# Patient Record
Sex: Female | Born: 1960 | Race: White | Hispanic: No | Marital: Married | Smoking: Never smoker
Health system: Southern US, Community
[De-identification: ages and names within clinical notes are randomized; demographics above are authoritative.]

## PROBLEM LIST (undated history)

## (undated) DIAGNOSIS — E785 Hyperlipidemia, unspecified: Secondary | ICD-10-CM

## (undated) DIAGNOSIS — B009 Herpesviral infection, unspecified: Secondary | ICD-10-CM

## (undated) DIAGNOSIS — K648 Other hemorrhoids: Secondary | ICD-10-CM

## (undated) DIAGNOSIS — K5790 Diverticulosis of intestine, part unspecified, without perforation or abscess without bleeding: Secondary | ICD-10-CM

## (undated) DIAGNOSIS — K5792 Diverticulitis of intestine, part unspecified, without perforation or abscess without bleeding: Secondary | ICD-10-CM

## (undated) DIAGNOSIS — M797 Fibromyalgia: Secondary | ICD-10-CM

## (undated) HISTORY — PX: COSMETIC SURGERY: SHX468

## (undated) HISTORY — DX: Other hemorrhoids: K64.8

## (undated) HISTORY — DX: Diverticulosis of intestine, part unspecified, without perforation or abscess without bleeding: K57.90

## (undated) HISTORY — PX: ABDOMINAL HYSTERECTOMY: SHX81

## (undated) HISTORY — DX: Diverticulitis of intestine, part unspecified, without perforation or abscess without bleeding: K57.92

## (undated) HISTORY — DX: Hyperlipidemia, unspecified: E78.5

## (undated) HISTORY — PX: CHOLECYSTECTOMY: SHX55

## (undated) HISTORY — DX: Herpesviral infection, unspecified: B00.9

## (undated) HISTORY — DX: Fibromyalgia: M79.7

---

## 2006-05-31 ENCOUNTER — Ambulatory Visit (HOSPITAL_COMMUNITY): Admission: RE | Admit: 2006-05-31 | Discharge: 2006-05-31 | Payer: Self-pay | Admitting: Obstetrics & Gynecology

## 2007-03-20 ENCOUNTER — Encounter: Admission: RE | Admit: 2007-03-20 | Discharge: 2007-03-20 | Payer: Self-pay | Admitting: Family Medicine

## 2007-03-29 ENCOUNTER — Encounter: Admission: RE | Admit: 2007-03-29 | Discharge: 2007-03-29 | Payer: Self-pay | Admitting: Family Medicine

## 2008-04-21 ENCOUNTER — Other Ambulatory Visit: Admission: RE | Admit: 2008-04-21 | Discharge: 2008-04-21 | Payer: Self-pay | Admitting: Internal Medicine

## 2008-04-21 ENCOUNTER — Ambulatory Visit: Payer: Self-pay | Admitting: Internal Medicine

## 2008-05-06 ENCOUNTER — Ambulatory Visit: Payer: Self-pay | Admitting: Internal Medicine

## 2008-09-02 ENCOUNTER — Ambulatory Visit: Payer: Self-pay | Admitting: Internal Medicine

## 2008-10-21 ENCOUNTER — Ambulatory Visit: Payer: Self-pay | Admitting: Internal Medicine

## 2008-12-18 ENCOUNTER — Ambulatory Visit: Payer: Self-pay | Admitting: Internal Medicine

## 2009-02-16 ENCOUNTER — Ambulatory Visit: Payer: Self-pay | Admitting: Internal Medicine

## 2009-07-14 ENCOUNTER — Ambulatory Visit: Payer: Self-pay | Admitting: Internal Medicine

## 2009-08-10 ENCOUNTER — Other Ambulatory Visit: Admission: RE | Admit: 2009-08-10 | Discharge: 2009-08-10 | Payer: Self-pay | Admitting: Internal Medicine

## 2009-08-10 ENCOUNTER — Ambulatory Visit: Payer: Self-pay | Admitting: Internal Medicine

## 2010-01-29 ENCOUNTER — Ambulatory Visit: Payer: Self-pay | Admitting: Internal Medicine

## 2010-02-08 ENCOUNTER — Ambulatory Visit: Payer: Self-pay | Admitting: Internal Medicine

## 2010-02-08 ENCOUNTER — Other Ambulatory Visit: Admission: RE | Admit: 2010-02-08 | Discharge: 2010-02-08 | Payer: Self-pay | Admitting: Internal Medicine

## 2010-02-08 LAB — HM PAP SMEAR

## 2010-02-11 ENCOUNTER — Ambulatory Visit: Payer: Self-pay | Admitting: Internal Medicine

## 2010-02-23 ENCOUNTER — Encounter: Payer: Self-pay | Admitting: Internal Medicine

## 2010-03-05 ENCOUNTER — Encounter (INDEPENDENT_AMBULATORY_CARE_PROVIDER_SITE_OTHER): Payer: Self-pay

## 2010-03-09 ENCOUNTER — Ambulatory Visit: Payer: Self-pay | Admitting: Internal Medicine

## 2010-03-16 ENCOUNTER — Ambulatory Visit: Payer: Self-pay | Admitting: Obstetrics and Gynecology

## 2010-03-29 ENCOUNTER — Ambulatory Visit: Payer: Self-pay | Admitting: Internal Medicine

## 2010-04-01 ENCOUNTER — Ambulatory Visit: Payer: Self-pay | Admitting: Internal Medicine

## 2010-07-20 NOTE — Procedures (Signed)
Summary: Colonoscopy  Patient: [name] Note: All result statuses are Final unless otherwise noted.  Tests: (1) Colonoscopy (COL)   COL Colonoscopy           DONE     [facility]     [address]     [place], [place]  [address]           COLONOSCOPY PROCEDURE REPORT           PATIENT:  Tammy Farley, Tammy Farley  MR#:  [id number]     BIRTHDATE:  09-Apr-1961, 49 yrs. old  GENDER:  female     ENDOSCOPIST:  Hedwig Morton. Juanda Chance, MD     REF. BY:  Sharlet Salina, M.D.     PROCEDURE DATE:  04/01/2010     PROCEDURE:  Colonoscopy 38756     ASA CLASS:  Class I     INDICATIONS:  Routine Risk Screening     MEDICATIONS:   Versed 7 mg, Fentanyl 50 mcg           DESCRIPTION OF PROCEDURE:   After the risks benefits and     alternatives of the procedure were thoroughly explained, informed     consent was obtained.  Digital rectal exam was performed and     revealed no rectal masses.   The LB PCF-H180AL [id number] endoscope     was introduced through the anus and advanced to the cecum, which     was identified by both the appendix and ileocecal valve, without     limitations.  The quality of the prep was good, using MiraLax.     The instrument was then slowly withdrawn as the colon was fully     examined.     <<PROCEDUREIMAGES>>           FINDINGS:  Mild diverticulosis was found in the sigmoid colon (see     image1, image4, image5, and image6).  Internal hemorrhoids were     found (see image7, image2, and image3).   Retroflexed views in the     rectum revealed no abnormalities.    The scope was then withdrawn     from the patient and the procedure completed.           COMPLICATIONS:  None     ENDOSCOPIC IMPRESSION:     1) Mild diverticulosis in the sigmoid colon     2) Internal hemorrhoids     RECOMMENDATIONS:     1) high fiber diet     REPEAT EXAM:  In 10 year(s) for.           ______________________________     Hedwig Morton. Juanda Chance, MD           CC:           n.     eSIGNED:   Hedwig Morton.  Brodie at 04/01/2010 10:45 AM           Laury Deep, [id number]  Note: An exclamation mark (!) indicates a result that was not dispersed into the flowsheet. Document Creation Date: 04/01/2010 10:45 AM _______________________________________________________________________  (1) Order result status: Final Collection or observation date-time: 04/01/2010 10:37 Requested date-time:  Receipt date-time:  Reported date-time:  Referring Physician:   Ordering Physician: Lina Sar [id number]) Specimen Source:  Source: Launa Grill Order Number: [id number] Lab site:   Appended Document: Colonoscopy    Clinical Lists Changes  Observations: Added new observation of COLONNXTDUE: 03/2020 (04/01/2010 10:57)

## 2010-07-20 NOTE — Letter (Signed)
Summary: Pre Visit Letter Revised  [facility]  [address] [place], [place] [address]   Phone: [phone]  Fax: [phone]        02/23/2010 MRN: [id number] Tammy Farley [address] [place], [place]  [address]             Procedure Date:  Sept 29,2011   Welcome to the Gastroenterology Division at [facility].    You are scheduled to see a nurse for your pre-procedure visit on Sept 20, 2011 at 10:30am on the 3rd floor at [facility], [address]  We ask that you try to arrive at our office 15 minutes prior to your appointment time to allow for check-in.  Please take a minute to review the attached form.  If you answer "Yes" to one or more of the questions on the first page, we ask that you call the person listed at your earliest opportunity.  If you answer "No" to all of the questions, please complete the rest of the form and bring it to your appointment.    Your nurse visit will consist of discussing your medical and surgical history, your immediate family medical history, and your medications.   If you are unable to list all of your medications on the form, please bring the medication bottles to your appointment and we will list them.  We will need to be aware of both prescribed and over the counter drugs.  We will need to know exact dosage information as well.    Please be prepared to read and sign documents such as consent forms, a financial agreement, and acknowledgement forms.  If necessary, and with your consent, a friend or relative is welcome to sit-in on the nurse visit with you.  Please bring your insurance card so that we may make a copy of it.  If your insurance requires a referral to see a specialist, please bring your referral form from your primary care physician.  No co-pay is required for this nurse visit.     If you cannot keep your appointment, please call [phone] to cancel or reschedule prior to your appointment  date.  This allows [place] the opportunity to schedule an appointment for another patient in need of care.    Thank you for choosing [facility] for your medical needs.  We appreciate the opportunity to care for you.  Please visit [place] at our website  to learn more about our practice.  Sincerely, The Gastroenterology Division

## 2010-07-20 NOTE — Miscellaneous (Signed)
Summary: Lec previsit  Clinical Lists Changes  Medications: Added new medication of MIRALAX   POWD (POLYETHYLENE GLYCOL 3350) As per prep  instructions. - Signed Added new medication of REGLAN 10 MG  TABS (METOCLOPRAMIDE HCL) As per prep instructions. - Signed Added new medication of DULCOLAX 5 MG  TBEC (BISACODYL) Day before procedure take 2 at 3pm and 2 at 8pm. - Signed Rx of MIRALAX   POWD (POLYETHYLENE GLYCOL 3350) As per prep  instructions.;  #255gm x 0;  Signed;  Entered by: Ulis Rias RN;  Authorized by: Hart Carwin MD;  Method used: Electronically to [facility] [address] [place], [place]  [address], Ph: [phone], Fax: [phone] Rx of REGLAN 10 MG  TABS (METOCLOPRAMIDE HCL) As per prep instructions.;  #2 x 0;  Signed;  Entered by: Ulis Rias RN;  Authorized by: Hart Carwin MD;  Method used: Electronically to [facility] [address], [place], [place]  [address], Ph: [phone], Fax: [phone] Rx of DULCOLAX 5 MG  TBEC (BISACODYL) Day before procedure take 2 at 3pm and 2 at 8pm.;  #4 x 0;  Signed;  Entered by: Ulis Rias RN;  Authorized by: Hart Carwin MD;  Method used: Electronically to [facility] [address] [place], [place]  [address], Ph: [phone], Fax: [phone] Observations: Added new observation of NKA: T (03/09/2010 10:24)    Prescriptions: DULCOLAX 5 MG  TBEC (BISACODYL) Day before procedure take 2 at 3pm and 2 at 8pm.  #4 x 0   Entered by:   Ulis Rias RN   Authorized by:   Hart Carwin MD   Signed by:   Ulis Rias RN on 03/09/2010   Method used:   Electronically to        [facility] [address] (retail)       [address]       [place], [place]  [address]       Ph: [phone]       Fax: [phone]   RxID:   [id number] REGLAN 10 MG  TABS (METOCLOPRAMIDE HCL) As per prep  instructions.  #2 x 0   Entered by:   Ulis Rias RN   Authorized by:   Hart Carwin MD   Signed by:   Ulis Rias RN on 03/09/2010   Method used:   Electronically to        [facility] [address] (retail)       [address]       [place], [place]  [address]       Ph: [phone]       Fax: [phone]   RxID:   [id number] MIRALAX   POWD (POLYETHYLENE GLYCOL 3350) As per prep  instructions.  #255gm x 0   Entered by:   Ulis Rias RN   Authorized by:   Hart Carwin MD   Signed by:   Ulis Rias RN on 03/09/2010   Method used:   Electronically to        [facility] [address] (retail)       [address] [place], [place]  [address]       Ph: [phone]       Fax: [phone]   RxID:   [id number]

## 2010-07-20 NOTE — Letter (Signed)
Summary: [facility] Instructions  [facility]  [address] [place], [place] [address]   Phone: [phone]  Fax: [phone]       Tammy Farley    1960/07/21    MRN: [id number]       Procedure Day /Date: Thursday 04-01-10     Arrival Time:  9:00 a.m.     Procedure Time: 10:00 a.m.     Location of Procedure:                    _x _  [facility] (4th Floor)  PREPARATION FOR COLONOSCOPY WITH MIRALAX  Starting 5 days prior to your procedure 03-27-10 do not eat nuts, seeds, popcorn, corn, beans, peas,  salads, or any raw vegetables.  Do not take any fiber supplements (e.g. Metamucil, Citrucel, and Benefiber). ____________________________________________________________________________________________________   THE DAY BEFORE YOUR PROCEDURE         DATE:  03-31-10 DAY:  Wednesday  1   Drink clear liquids the entire day-NO SOLID FOOD  2   Do not drink anything colored red or purple.  Avoid juices with pulp.  No orange juice.  3   Drink at least 64 oz. (8 glasses) of fluid/clear liquids during the day to prevent dehydration and help the prep work efficiently.  CLEAR LIQUIDS INCLUDE: Water Jello Ice Popsicles Tea (sugar ok, no milk/cream) Powdered fruit flavored drinks Coffee (sugar ok, no milk/cream) Gatorade Juice: apple, white grape, white cranberry  Lemonade Clear bullion, consomm, broth Carbonated beverages (any kind) Strained chicken noodle soup Hard Candy  4   Mix the entire bottle of Miralax with 64 oz. of Gatorade/Powerade in the morning and put in the refrigerator to chill.  5   At 3:00 pm take 2 Dulcolax/Bisacodyl tablets.  6   At 4:30 pm take one Reglan/Metoclopramide tablet.  7  Starting at 5:00 pm drink one 8 oz glass of the Miralax mixture every 15-20 minutes until you have finished drinking the entire 64 oz.  You should finish drinking prep around 7:30 or 8:00 pm.  8   If you are nauseated, you may take the 2nd  Reglan/Metoclopramide tablet at 6:30 pm.        9    At 8:00 pm take 2 more DULCOLAX/Bisacodyl tablets.     THE DAY OF YOUR PROCEDURE      DATE:   04-01-10  DAY:  Thursday  You may drink clear liquids until  8:00 a.m. (2 HOURS BEFORE PROCEDURE).   MEDICATION INSTRUCTIONS  Unless otherwise instructed, you should take regular prescription medications with a small sip of water as early as possible the morning of your procedure.         OTHER INSTRUCTIONS  You will need a responsible adult at least 52 years of age to accompany you and drive you home.   This person must remain in the waiting room during your procedure.  Wear loose fitting clothing that is easily removed.  Leave jewelry and other valuables at home.  However, you may wish to bring a book to read or an iPod/MP3 player to listen to music as you wait for your procedure to start.  Remove all body piercing jewelry and leave at home.  Total time from sign-in until discharge is approximately 2-3 hours.  You should go home directly after your procedure and rest.  You can resume normal activities the day after your procedure.  The day of your procedure you should not:  Drive   Make legal decisions   Operate machinery   Drink alcohol   Return to work  You will receive specific instructions about eating, activities and medications before you leave.   The above instructions have been reviewed and explained to me by   Ulis Rias RN  March 09, 2010 10:50 AM      I fully understand and can verbalize these instructions _____________________________ Date _______

## 2010-08-10 ENCOUNTER — Ambulatory Visit
Admission: RE | Admit: 2010-08-10 | Discharge: 2010-08-10 | Disposition: A | Discharge status: Home / Self Care | Payer: BC Managed Care – PPO | Source: Ambulatory Visit | Attending: Internal Medicine | Admitting: Internal Medicine

## 2010-08-10 ENCOUNTER — Other Ambulatory Visit: Payer: Self-pay | Admitting: Internal Medicine

## 2010-08-10 ENCOUNTER — Ambulatory Visit (INDEPENDENT_AMBULATORY_CARE_PROVIDER_SITE_OTHER): Payer: BC Managed Care – PPO | Admitting: Internal Medicine

## 2010-08-10 DIAGNOSIS — R1032 Left lower quadrant pain: Secondary | ICD-10-CM

## 2010-08-10 DIAGNOSIS — R140 Abdominal distension (gaseous): Secondary | ICD-10-CM

## 2010-08-10 DIAGNOSIS — K5732 Diverticulitis of large intestine without perforation or abscess without bleeding: Secondary | ICD-10-CM

## 2010-08-10 MED ORDER — IOHEXOL 300 MG/ML  SOLN
100.0000 mL | Freq: Once | INTRAMUSCULAR | Status: AC | PRN
  Administered 2010-08-10: 100 mL via INTRAVENOUS

## 2010-08-12 ENCOUNTER — Encounter: Payer: Self-pay | Admitting: Internal Medicine

## 2010-08-24 ENCOUNTER — Encounter (INDEPENDENT_AMBULATORY_CARE_PROVIDER_SITE_OTHER): Payer: BC Managed Care – PPO | Admitting: Internal Medicine

## 2010-08-24 DIAGNOSIS — F411 Generalized anxiety disorder: Secondary | ICD-10-CM

## 2010-08-24 DIAGNOSIS — IMO0001 Reserved for inherently not codable concepts without codable children: Secondary | ICD-10-CM

## 2010-08-24 DIAGNOSIS — K5732 Diverticulitis of large intestine without perforation or abscess without bleeding: Secondary | ICD-10-CM

## 2010-08-24 DIAGNOSIS — E785 Hyperlipidemia, unspecified: Secondary | ICD-10-CM

## 2010-09-29 ENCOUNTER — Ambulatory Visit: Payer: BC Managed Care – PPO | Admitting: Obstetrics and Gynecology

## 2010-10-04 ENCOUNTER — Other Ambulatory Visit (HOSPITAL_COMMUNITY)
Admission: RE | Admit: 2010-10-04 | Discharge: 2010-10-04 | Disposition: A | Discharge status: Home / Self Care | Payer: BC Managed Care – PPO | Source: Ambulatory Visit | Attending: Obstetrics and Gynecology | Admitting: Obstetrics and Gynecology

## 2010-10-04 ENCOUNTER — Ambulatory Visit (INDEPENDENT_AMBULATORY_CARE_PROVIDER_SITE_OTHER): Payer: BC Managed Care – PPO | Admitting: Obstetrics and Gynecology

## 2010-10-04 ENCOUNTER — Other Ambulatory Visit: Payer: Self-pay | Admitting: Obstetrics and Gynecology

## 2010-10-04 DIAGNOSIS — N893 Dysplasia of vagina, unspecified: Secondary | ICD-10-CM

## 2010-10-04 DIAGNOSIS — R87619 Unspecified abnormal cytological findings in specimens from cervix uteri: Secondary | ICD-10-CM

## 2010-10-04 DIAGNOSIS — R1031 Right lower quadrant pain: Secondary | ICD-10-CM

## 2010-10-04 DIAGNOSIS — N950 Postmenopausal bleeding: Secondary | ICD-10-CM

## 2010-10-06 ENCOUNTER — Ambulatory Visit (INDEPENDENT_AMBULATORY_CARE_PROVIDER_SITE_OTHER): Payer: BC Managed Care – PPO | Admitting: Obstetrics and Gynecology

## 2010-10-06 ENCOUNTER — Other Ambulatory Visit: Payer: BC Managed Care – PPO

## 2010-10-06 DIAGNOSIS — N390 Urinary tract infection, site not specified: Secondary | ICD-10-CM

## 2010-10-06 DIAGNOSIS — N3941 Urge incontinence: Secondary | ICD-10-CM

## 2010-10-06 DIAGNOSIS — N950 Postmenopausal bleeding: Secondary | ICD-10-CM

## 2010-10-06 DIAGNOSIS — N83209 Unspecified ovarian cyst, unspecified side: Secondary | ICD-10-CM

## 2010-10-06 DIAGNOSIS — N393 Stress incontinence (female) (male): Secondary | ICD-10-CM

## 2010-10-13 ENCOUNTER — Ambulatory Visit (INDEPENDENT_AMBULATORY_CARE_PROVIDER_SITE_OTHER): Payer: BC Managed Care – PPO | Admitting: Obstetrics and Gynecology

## 2010-10-13 DIAGNOSIS — N950 Postmenopausal bleeding: Secondary | ICD-10-CM

## 2010-10-13 DIAGNOSIS — B373 Candidiasis of vulva and vagina: Secondary | ICD-10-CM

## 2010-10-13 DIAGNOSIS — N390 Urinary tract infection, site not specified: Secondary | ICD-10-CM

## 2010-10-13 DIAGNOSIS — N898 Other specified noninflammatory disorders of vagina: Secondary | ICD-10-CM

## 2010-11-08 ENCOUNTER — Other Ambulatory Visit: Payer: Self-pay | Admitting: *Deleted

## 2010-11-08 MED ORDER — HYDROCODONE-ACETAMINOPHEN 5-325 MG PO TABS: 1.0000 | ORAL_TABLET | Freq: Four times a day (QID) | ORAL | Status: DC | PRN

## 2010-11-09 ENCOUNTER — Encounter (INDEPENDENT_AMBULATORY_CARE_PROVIDER_SITE_OTHER): Payer: BC Managed Care – PPO

## 2010-11-09 DIAGNOSIS — Z13820 Encounter for screening for osteoporosis: Secondary | ICD-10-CM

## 2011-02-17 ENCOUNTER — Encounter: Payer: Self-pay | Admitting: Internal Medicine

## 2011-02-22 ENCOUNTER — Other Ambulatory Visit: Payer: BC Managed Care – PPO | Admitting: Internal Medicine

## 2011-02-23 ENCOUNTER — Telehealth: Payer: Self-pay

## 2011-02-23 DIAGNOSIS — Z79899 Other long term (current) drug therapy: Secondary | ICD-10-CM

## 2011-02-23 DIAGNOSIS — E785 Hyperlipidemia, unspecified: Secondary | ICD-10-CM

## 2011-02-23 NOTE — Telephone Encounter (Signed)
Patient here for labs only today.  

## 2011-02-24 ENCOUNTER — Ambulatory Visit (INDEPENDENT_AMBULATORY_CARE_PROVIDER_SITE_OTHER): Payer: BC Managed Care – PPO | Admitting: Internal Medicine

## 2011-02-24 ENCOUNTER — Encounter: Payer: Self-pay | Admitting: Internal Medicine

## 2011-02-24 DIAGNOSIS — K5790 Diverticulosis of intestine, part unspecified, without perforation or abscess without bleeding: Secondary | ICD-10-CM

## 2011-02-24 DIAGNOSIS — K5730 Diverticulosis of large intestine without perforation or abscess without bleeding: Secondary | ICD-10-CM

## 2011-02-24 DIAGNOSIS — M797 Fibromyalgia: Secondary | ICD-10-CM

## 2011-02-24 DIAGNOSIS — M25819 Other specified joint disorders, unspecified shoulder: Secondary | ICD-10-CM

## 2011-02-24 DIAGNOSIS — E559 Vitamin D deficiency, unspecified: Secondary | ICD-10-CM

## 2011-02-24 DIAGNOSIS — IMO0001 Reserved for inherently not codable concepts without codable children: Secondary | ICD-10-CM

## 2011-02-24 DIAGNOSIS — M7541 Impingement syndrome of right shoulder: Secondary | ICD-10-CM

## 2011-02-24 DIAGNOSIS — E785 Hyperlipidemia, unspecified: Secondary | ICD-10-CM

## 2011-02-24 LAB — HEPATIC FUNCTION PANEL
Albumin: 4.1 g/dL (ref 3.5–5.2)
Alkaline Phosphatase: 46 U/L (ref 39–117)
Total Bilirubin: 0.3 mg/dL (ref 0.3–1.2)
Total Protein: 6.5 g/dL (ref 6.0–8.3)

## 2011-03-03 ENCOUNTER — Other Ambulatory Visit: Payer: Self-pay | Admitting: Internal Medicine

## 2011-03-03 MED ORDER — HYDROCODONE-ACETAMINOPHEN 5-325 MG PO TABS: 1.0000 | ORAL_TABLET | Freq: Four times a day (QID) | ORAL | Status: AC | PRN

## 2011-03-17 DIAGNOSIS — M797 Fibromyalgia: Secondary | ICD-10-CM | POA: Insufficient documentation

## 2011-03-17 DIAGNOSIS — M7541 Impingement syndrome of right shoulder: Secondary | ICD-10-CM | POA: Insufficient documentation

## 2011-03-17 DIAGNOSIS — K5790 Diverticulosis of intestine, part unspecified, without perforation or abscess without bleeding: Secondary | ICD-10-CM | POA: Insufficient documentation

## 2011-03-17 DIAGNOSIS — E785 Hyperlipidemia, unspecified: Secondary | ICD-10-CM | POA: Insufficient documentation

## 2011-03-17 DIAGNOSIS — E559 Vitamin D deficiency, unspecified: Secondary | ICD-10-CM | POA: Insufficient documentation

## 2011-03-17 NOTE — Progress Notes (Signed)
  Subjective:    Patient ID: Tammy Farley, female    DOB: 1961-02-27, 50 y.o.   MRN: [id number]  HPI pleasant 50 year old female with history of vitamin D deficiency, hyperlipidemia, fibromyalgia, diverticulosis for six-month recheck. Is on Lipitor. Takes a lot of medication to control fibromyalgia since films including occasional Tylox, takes Vicodin 5/500 3 times a day, has Valium 5 mg when necessary as a muscle relaxant. UTI October 2011. History of diverticulitis February 2012. Had mammogram March 2012. Colonoscopy by Dr. Juanda Chance 2011. New complaint today is right shoulder pain for several months.    Review of Systems     Objective:   Physical Exam chest clear to auscultation; cardiac exam regular rate and rhythm; extremities without edema or deformity; neck supple without thyromegaly or bruits. Decreased range of motion right shoulder        Assessment & Plan:  Hyperlipidemia  Fibromyalgia syndrome  Right shoulder pain  Diverticulosis  Patient likely has impingement right shoulder and will be referred to hand surgeon for evaluation. Appointment is 03/04/2011

## 2011-03-22 ENCOUNTER — Other Ambulatory Visit: Payer: BC Managed Care – PPO | Admitting: Internal Medicine

## 2011-03-22 DIAGNOSIS — E785 Hyperlipidemia, unspecified: Secondary | ICD-10-CM

## 2011-03-22 LAB — LIPID PANEL
Cholesterol: 204 mg/dL — ABNORMAL HIGH (ref 0–200)
HDL: 59 mg/dL (ref >39)
Total CHOL/HDL Ratio: 3.5 Ratio

## 2011-03-24 ENCOUNTER — Encounter: Payer: Self-pay | Admitting: Internal Medicine

## 2011-05-19 ENCOUNTER — Ambulatory Visit (INDEPENDENT_AMBULATORY_CARE_PROVIDER_SITE_OTHER): Payer: BC Managed Care – PPO | Admitting: Internal Medicine

## 2011-05-19 ENCOUNTER — Encounter: Payer: Self-pay | Admitting: Internal Medicine

## 2011-05-19 ENCOUNTER — Ambulatory Visit
Admission: RE | Admit: 2011-05-19 | Discharge: 2011-05-19 | Disposition: A | Discharge status: Home / Self Care | Payer: BC Managed Care – PPO | Source: Ambulatory Visit | Attending: Internal Medicine | Admitting: Internal Medicine

## 2011-05-19 DIAGNOSIS — IMO0001 Reserved for inherently not codable concepts without codable children: Secondary | ICD-10-CM

## 2011-05-19 DIAGNOSIS — Z23 Encounter for immunization: Secondary | ICD-10-CM

## 2011-05-19 DIAGNOSIS — K5732 Diverticulitis of large intestine without perforation or abscess without bleeding: Secondary | ICD-10-CM

## 2011-05-19 DIAGNOSIS — F32A Depression, unspecified: Secondary | ICD-10-CM | POA: Insufficient documentation

## 2011-05-19 DIAGNOSIS — Z8719 Personal history of other diseases of the digestive system: Secondary | ICD-10-CM | POA: Insufficient documentation

## 2011-05-19 DIAGNOSIS — K5909 Other constipation: Secondary | ICD-10-CM

## 2011-05-19 DIAGNOSIS — K5792 Diverticulitis of intestine, part unspecified, without perforation or abscess without bleeding: Secondary | ICD-10-CM

## 2011-05-19 DIAGNOSIS — R109 Unspecified abdominal pain: Secondary | ICD-10-CM

## 2011-05-19 DIAGNOSIS — F341 Dysthymic disorder: Secondary | ICD-10-CM

## 2011-05-19 DIAGNOSIS — F419 Anxiety disorder, unspecified: Secondary | ICD-10-CM

## 2011-05-19 DIAGNOSIS — F329 Major depressive disorder, single episode, unspecified: Secondary | ICD-10-CM | POA: Insufficient documentation

## 2011-05-19 DIAGNOSIS — K5900 Constipation, unspecified: Secondary | ICD-10-CM

## 2011-05-19 DIAGNOSIS — J069 Acute upper respiratory infection, unspecified: Secondary | ICD-10-CM

## 2011-05-19 DIAGNOSIS — N390 Urinary tract infection, site not specified: Secondary | ICD-10-CM

## 2011-05-19 DIAGNOSIS — M797 Fibromyalgia: Secondary | ICD-10-CM

## 2011-05-19 LAB — POCT URINALYSIS DIPSTICK
Bilirubin, UA: NEGATIVE
Glucose, UA: NEGATIVE
Spec Grav, UA: <=1.005
Urobilinogen, UA: NEGATIVE

## 2011-05-19 LAB — CBC WITH DIFFERENTIAL/PLATELET
HCT: 42.2 % (ref 36.0–46.0)
Hemoglobin: 13.6 g/dL (ref 12.0–15.0)
Lymphocytes Relative: 41 % (ref 12–46)
MCHC: 32.2 g/dL (ref 30.0–36.0)
MCV: 88 fL (ref 78.0–100.0)
Monocytes Absolute: 0.4 10*3/uL (ref 0.1–1.0)
Monocytes Relative: 6 % (ref 3–12)
RDW: 12.2 % (ref 11.5–15.5)

## 2011-05-19 NOTE — Patient Instructions (Signed)
Take Cipro and Flagyl as prescribed. This should cover urinary tract infection as well as diverticulitis. A urine culture is pending. Call if not better in 48-72 hours or sooner if worse. Keep appointment for followup in 2 weeks. Please do not take magnesium citrate when you get these episodes of abdominal pain. Please do not wait to seek treatment for further episodes of diverticulitis.

## 2011-05-19 NOTE — Progress Notes (Signed)
  Subjective:    Patient ID: Tammy Farley, female    DOB: 03-04-1961, 50 y.o.   MRN: [id number]  HPI 50 year old female with history of fibromyalgia syndrome on chronic narcotic pain medication. Says that for a week she has had abdominal discomfort and bloating. Has failed to seek treatment until now. Complains of sinus infection symptoms. Has some right lower back pain. Says she's had one episode of vomiting on November 24. She decided to take magnesium citrate for abdominal discomfort and bloating. Did get relief of some symptoms but continues to have left lower quadrant pain. A few months ago treated for acute diverticulitis. See CT scan report. History of hyperlipidemia, anxiety, depression. Had colonoscopy by Dr. Juanda Chance in 2011. This was before her episode of diverticulitis. Does take MiraLAX on a daily basis.    Review of Systems     Objective:   Physical Exam HEENT exam: TMs and pharynx are clear; neck is supple, no adenopathy; chest clear. abdomen: Bowel sounds increased, abdomen is not distended, is obese and soft, tender left lower quadrant without rebound tenderness. Also has abnormal urinalysis. Culture was sent.        Assessment & Plan:  Diverticulitis-second episode in 2012  URI  Urinary tract infection  History of hyperlipidemia  History of anxiety depression  History of fibromyalgia syndrome on chronic narcotic medication which likely aggravates abdominal symptoms with tendency to be constipated. She does take MiraLAX on a daily basis.  Plan: Patient started on Cipro 500 mg by mouth twice daily for 10 days; Flagyl 500 mg by mouth twice daily for 7 days. Phenergan 25 mg (#30) 1 by mouth every 4 hours when necessary nausea. Would prefer the patient not treat herself with magnesium citrate when she gets the symptoms. We'll have her followup here in 2 weeks. Will need repeat urinalysis at that time. Will also need to get appointment for her to see gastroenterologist  perhaps in 6 weeks or so.  Addendum: White blood cell count within normal limits; KUB flat and upright shows no free air under diaphragm and no bowel obstruction.  3 Hemoccult cards given to do at home and return.

## 2011-05-22 LAB — URINE CULTURE: Colony Count: >=100000

## 2011-05-23 ENCOUNTER — Other Ambulatory Visit: Payer: Self-pay

## 2011-05-23 MED ORDER — AMOXICILLIN 500 MG PO CAPS: 500.0000 mg | ORAL_CAPSULE | Freq: Three times a day (TID) | ORAL | Status: AC

## 2011-05-23 NOTE — Telephone Encounter (Signed)
Spoke with patient regarding urine culture results. Shows E Coli-resistant to Cipro. Rx for Amoxicillin faxed to [facility] in [place], [place].

## 2011-05-31 ENCOUNTER — Ambulatory Visit: Payer: BC Managed Care – PPO | Admitting: Internal Medicine

## 2011-06-06 ENCOUNTER — Ambulatory Visit: Payer: BC Managed Care – PPO | Admitting: Internal Medicine

## 2011-06-10 ENCOUNTER — Encounter: Payer: Self-pay | Admitting: Internal Medicine

## 2011-06-10 ENCOUNTER — Ambulatory Visit (INDEPENDENT_AMBULATORY_CARE_PROVIDER_SITE_OTHER): Payer: BC Managed Care – PPO | Admitting: Internal Medicine

## 2011-06-10 VITALS — BP 108/84 | Temp 98.5°F | Wt 175.0 lb

## 2011-06-10 DIAGNOSIS — N390 Urinary tract infection, site not specified: Secondary | ICD-10-CM

## 2011-06-10 DIAGNOSIS — R1031 Right lower quadrant pain: Secondary | ICD-10-CM

## 2011-06-13 ENCOUNTER — Other Ambulatory Visit: Payer: Self-pay

## 2011-06-13 MED ORDER — BUPROPION HCL ER (XL) 300 MG PO TB24: 300.0000 mg | ORAL_TABLET | Freq: Every day | ORAL | Status: DC

## 2011-06-17 ENCOUNTER — Ambulatory Visit: Payer: BC Managed Care – PPO | Admitting: Internal Medicine

## 2011-06-18 NOTE — Patient Instructions (Signed)
We will obtain appointment with gastroenterologist as soon as possible. Continue advancing diet slowly.

## 2011-06-18 NOTE — Progress Notes (Signed)
  Subjective:    Patient ID: Tammy Farley, female    DOB: 24-Feb-1961, 50 y.o.   MRN: [id number]  HPI 50 year old female of [place] descent in today for followup urinalysis status post treatment for urinary tract infection. Now has developed recurrent issues with abdominal pain. Has had bouts of diverticulitis. She complains today of persistent abdominal pain. Unable to eat regular diet. Urinalysis today is now normal status post treatment for UTI. She has been on Cipro for the urinary infection but yet has persistent abdominal pain. I am concerned she has chronic diverticulitis. She has history of fibromyalgia syndrome and takes a lot of narcotic medication to control her pain which causes constipation.    Review of Systems     Objective:   Physical Exam abdomen is soft and nondistended. Slightly obese. No hepatosplenomegaly appreciated. Has tenderness lower abdomen without significant rebound tenderness.        Assessment & Plan:  Fibromyalgia syndrome requiring narcotic medication to control  Hyperlipidemia  History of recurrent abdominal pain  History of urinary tract infections  Plan: Appointment with gastroenterologist as soon as possible for further evaluation. Advance diet slowly if possible. Call if symptoms worsen

## 2011-06-22 ENCOUNTER — Telehealth: Payer: Self-pay | Admitting: Internal Medicine

## 2011-06-22 NOTE — Telephone Encounter (Signed)
Spoke with patient and she states Dr. Lenord Fellers wanted her to schedule an OV with Dr. Juanda Chance because she has had 2 diverticulitis flares in the last year. She states she has "quieted down now" but wants to schedule an OV. Scheduled patient on 06/24/11 at 10:45/11:00 AM with Dr. Juanda Chance.

## 2011-06-23 ENCOUNTER — Encounter: Payer: Self-pay | Admitting: *Deleted

## 2011-06-24 ENCOUNTER — Ambulatory Visit (INDEPENDENT_AMBULATORY_CARE_PROVIDER_SITE_OTHER): Payer: BC Managed Care – PPO | Admitting: Internal Medicine

## 2011-06-24 ENCOUNTER — Encounter: Payer: Self-pay | Admitting: Internal Medicine

## 2011-06-24 ENCOUNTER — Other Ambulatory Visit (INDEPENDENT_AMBULATORY_CARE_PROVIDER_SITE_OTHER): Payer: BC Managed Care – PPO

## 2011-06-24 VITALS — BP 110/72 | HR 80 | Ht 62.0 in | Wt 172.6 lb

## 2011-06-24 DIAGNOSIS — K5732 Diverticulitis of large intestine without perforation or abscess without bleeding: Secondary | ICD-10-CM

## 2011-06-24 DIAGNOSIS — R1033 Periumbilical pain: Secondary | ICD-10-CM

## 2011-06-24 DIAGNOSIS — R197 Diarrhea, unspecified: Secondary | ICD-10-CM

## 2011-06-24 LAB — SEDIMENTATION RATE: Sed Rate: 21 mm/hr (ref 0–22)

## 2011-06-24 MED ORDER — LUBIPROSTONE 24 MCG PO CAPS: 24.0000 ug | ORAL_CAPSULE | Freq: Every day | ORAL | Status: DC

## 2011-06-24 MED ORDER — PSYLLIUM 28 % PO PACK: 1.0000 | PACK | Freq: Every day | ORAL | Status: AC

## 2011-06-24 NOTE — Progress Notes (Signed)
  Tammy Farley [birth year] MRN [id number]   History of Present Illness:  This is a 51 year old white female with abdominal bloating, diffuse tenderness mostly in the lower quadrants and irregular bowel habits with predominant constipation. She had documented diverticulitis in February 2012 presenting with lower abdominal pain and an abnormal CT scan of the abdomen showing stranding around the sigmoid colon. She had a recurrence of abdominal pain 3 months ago and was treated at that time for a urinary tract infection with amoxicillin and subsequently with Flagyl and Cipro. She still continues to have abdominal discomfort and generalized tenderness. There has been no fever or rectal bleeding. She has constipation for which she takes MiraLax 3 times a week. A colonoscopy in October 2011 showed mild diverticulosis of the left colon and small internal hemorrhoids. She claims to have good eating habits, avoiding seeds and eating salads. Her weight has increased gradually. She denies having excessive stress. She was diagnosed with irritable bowel syndrome .   Past Medical History  Diagnosis Date  . Hyperlipidemia   . Vitamin D deficiency   . Fibromyalgia   . Diverticulitis   . Diverticulosis   . Internal hemorrhoids    Past Surgical History  Procedure Date  . Cholecystectomy   . Abdominal hysterectomy   . Cosmetic surgery     tummy tuck    reports that she has never smoked. She has never used smokeless tobacco. She reports that she does not drink alcohol or use illicit drugs. family history includes Diabetes in her mother; Liver cancer in her father; and Pancreatic cancer in her mother. No Known Allergies      Review of Systems: Denies heartburn, difficulty swallowing. Chest pain or shortness of breath  The remainder of the 10 point ROS is negative except as outlined in H&P   Physical Exam: General appearance  Well developed, in no distress. Eyes- non icteric. HEENT nontraumatic,  normocephalic. Mouth no lesions, tongue papillated, no cheilosis. Neck supple without adenopathy, thyroid not enlarged, no carotid bruits, no JVD. Lungs Clear to auscultation bilaterally. Cor normal S1, normal S2, regular rhythm, no murmur,  quiet precordium. Abdomen: Status post abdominoplasty. Normal active bowel sounds. Diffusely tender abdomen in all quadrants; mostly epigastrium and lower abdomen. There is no rebound, no palpable mass. Rectal: Soft Hemoccult negative stool Extremities no pedal edema. Skin no lesions. Neurological alert and oriented x 3. Psychological normal mood and affect.  Assessment and Plan:  Problem #1 Diffuse nonspecific lower abdominal discomfort with a history of diverticulitis one year ago. The current episode is more suggestive of irritable bowel syndrome. She certainly did not respond to 2 courses of antibiotics. We will start her on Amitiza 24 mcg daily which will be increased to 2 a day. We gave her samples of Metamucil one package daily. We will check her sedimentation rate and sprue profile today. I would like to recheck her in 8 weeks.   06/24/2011 Tammy Farley

## 2011-06-24 NOTE — Patient Instructions (Addendum)
We have sent the following medications to your pharmacy for you to pick up at your convenience: Amitiza 24 mcg once daily Please purchase Metamucil to take once daily. Your physician has requested that you go to the basement for the following lab work before leaving today: Sprue Profile, Sedimentation Rate CC: Dr Marlan Palau  High Fiber Diet A high fiber diet changes your normal diet to include more whole grains, legumes, fruits, and vegetables. Changes in the diet involve replacing refined carbohydrates with unrefined foods. The calorie level of the diet is essentially unchanged. The Dietary Reference Intake (recommended amount) for adult males is 38 g per day. For adult females, it is 25 g per day. Pregnant and lactating women should consume 28 g of fiber per day. Fiber is the intact part of a plant that is not broken down during digestion. Functional fiber is fiber that has been isolated from the plant to provide a beneficial effect in the body. PURPOSE  Increase stool bulk.   Ease and regulate bowel movements.   Lower cholesterol.  INDICATIONS THAT YOU NEED MORE FIBER  Constipation and hemorrhoids.   Uncomplicated diverticulosis (intestine condition) and irritable bowel syndrome.   Weight management.   As a protective measure against hardening of the arteries (atherosclerosis), diabetes, and cancer.  NOTE OF CAUTION If you have a digestive or bowel problem, ask your caregiver for advice before adding high fiber foods to your diet. Some of the following medical problems are such that a high fiber diet should not be used without consulting your caregiver:  Acute diverticulitis (intestine infection).   Partial small bowel obstructions.   Complicated diverticular disease involving bleeding, rupture (perforation), or abscess (boil, furuncle).   Presence of autonomic neuropathy (nerve damage) or gastric paresis (stomach cannot empty itself).  GUIDELINES FOR INCREASING FIBER  Start  adding fiber to the diet slowly. A gradual increase of about 5 more grams (2 slices of whole-wheat bread, 2 servings of most fruits or vegetables, or 1 bowl of high fiber cereal) per day is best. Too rapid an increase in fiber may result in constipation, flatulence, and bloating.   Drink enough water and fluids to keep your urine clear or pale yellow. Water, juice, or caffeine-free drinks are recommended. Not drinking enough fluid may cause constipation.   Eat a variety of high fiber foods rather than one type of fiber.   Try to increase your intake of fiber through using high fiber foods rather than fiber pills or supplements that contain small amounts of fiber.   The goal is to change the types of food eaten. Do not supplement your present diet with high fiber foods, but replace foods in your present diet.  INCLUDE A VARIETY OF FIBER SOURCES  Replace refined and processed grains with whole grains, canned fruits with fresh fruits, and incorporate other fiber sources. White rice, white breads, and most bakery goods contain little or no fiber.   Brown whole-grain rice, buckwheat oats, and many fruits and vegetables are all good sources of fiber. These include: broccoli, Brussels sprouts, cabbage, cauliflower, beets, sweet potatoes, white potatoes (skin on), carrots, tomatoes, eggplant, squash, berries, fresh fruits, and dried fruits.   Cereals appear to be the richest source of fiber. Cereal fiber is found in whole grains and bran. Bran is the fiber-rich outer coat of cereal grain, which is largely removed in refining. In whole-grain cereals, the bran remains. In breakfast cereals, the largest amount of fiber is found in those with "bran" in  their names. The fiber content is sometimes indicated on the label.   You may need to include additional fruits and vegetables each day.   In baking, for 1 cup white flour, you may use the following substitutions:   1 cup whole-wheat flour minus 2 tbs.     cup white flour plus  cup whole-wheat flour.  Document Released: 06/06/2005 Document Revised: 02/16/2011 Document Reviewed: 04/14/2009 [facility] Patient Information 2012 [place], [place].

## 2011-06-27 ENCOUNTER — Telehealth: Payer: Self-pay | Admitting: Internal Medicine

## 2011-06-27 LAB — CELIAC PANEL 10
Endomysial Screen: NEGATIVE
Gliadin IgA: 4.1 U/mL (ref <20)
IgA: 117 mg/dL (ref 69–380)
Tissue Transglutaminase Ab, IgA: 5.4 U/mL (ref <20)

## 2011-06-27 NOTE — Telephone Encounter (Signed)
Dr Juanda Chance- Patient states that the amitiza prescribed to her is $55.00 after insurance pays. She would like to know if you would give her something comparable to this that is cheaper. I advised her that there are not many medications in capsule form that are very similar to [place] but that I would check to see if there is anything else you would like her to try. Patient states that the Amitiza has been working for her.

## 2011-06-27 NOTE — Telephone Encounter (Signed)
We have discussed the cost prior to prescribing it. There is no generic form of Amitiza. Miralx is an alternative, she has been on it.

## 2011-06-28 ENCOUNTER — Telehealth: Payer: Self-pay | Admitting: *Deleted

## 2011-06-28 NOTE — Telephone Encounter (Signed)
Notes Recorded by Hart Carwin, MD on 06/27/2011 at 8:28 PM Please call pt with normal results of the blood tests Spoke with patient and gave her results as per Dr. Juanda Chance.

## 2011-06-28 NOTE — Telephone Encounter (Signed)
Spoke with patient and advised her that per Dr Juanda Chance, there is nothing really comparable to Amitiza as she has already tried Miralax without success. Patient verbalizes understanding and states that she will "stick with them Amitiza."

## 2011-08-03 ENCOUNTER — Other Ambulatory Visit: Payer: Self-pay

## 2011-08-03 MED ORDER — ATORVASTATIN CALCIUM 10 MG PO TABS: 10.0000 mg | ORAL_TABLET | Freq: Every day | ORAL | Status: DC

## 2011-09-01 ENCOUNTER — Other Ambulatory Visit: Payer: Self-pay

## 2011-09-26 ENCOUNTER — Other Ambulatory Visit: Payer: Self-pay | Admitting: Internal Medicine

## 2011-09-27 ENCOUNTER — Other Ambulatory Visit: Payer: Self-pay

## 2011-09-27 NOTE — Telephone Encounter (Signed)
Please call in refill x 6 months 

## 2011-10-12 ENCOUNTER — Other Ambulatory Visit: Payer: Self-pay

## 2011-10-12 MED ORDER — DIAZEPAM 5 MG PO TABS: 5.0000 mg | ORAL_TABLET | Freq: Two times a day (BID) | ORAL | Status: DC | PRN

## 2011-11-08 ENCOUNTER — Other Ambulatory Visit: Payer: Self-pay | Admitting: Internal Medicine

## 2011-12-29 ENCOUNTER — Other Ambulatory Visit: Payer: Self-pay | Admitting: Internal Medicine

## 2012-02-15 ENCOUNTER — Other Ambulatory Visit: Payer: Self-pay | Admitting: Internal Medicine

## 2012-02-28 ENCOUNTER — Other Ambulatory Visit: Payer: Self-pay | Admitting: Internal Medicine

## 2012-02-29 ENCOUNTER — Other Ambulatory Visit: Payer: Self-pay

## 2012-02-29 NOTE — Telephone Encounter (Signed)
Refill #90 with 3 refills

## 2012-03-20 ENCOUNTER — Other Ambulatory Visit: Payer: Self-pay | Admitting: Internal Medicine

## 2012-03-27 ENCOUNTER — Other Ambulatory Visit: Payer: Self-pay | Admitting: Internal Medicine

## 2012-03-28 ENCOUNTER — Other Ambulatory Visit: Payer: Self-pay

## 2012-03-28 MED ORDER — ATORVASTATIN CALCIUM 10 MG PO TABS: 10.0000 mg | ORAL_TABLET | Freq: Every day | ORAL | Status: DC

## 2012-05-30 ENCOUNTER — Other Ambulatory Visit: Payer: Self-pay

## 2012-05-30 MED ORDER — DIAZEPAM 5 MG PO TABS: 5.0000 mg | ORAL_TABLET | Freq: Two times a day (BID) | ORAL | Status: DC | PRN

## 2012-06-25 ENCOUNTER — Other Ambulatory Visit: Payer: Self-pay | Admitting: Internal Medicine

## 2012-06-26 ENCOUNTER — Ambulatory Visit (INDEPENDENT_AMBULATORY_CARE_PROVIDER_SITE_OTHER): Payer: BC Managed Care – PPO | Admitting: Internal Medicine

## 2012-06-26 VITALS — BP 118/86 | HR 80 | Temp 98.5°F | Wt 178.0 lb

## 2012-06-26 DIAGNOSIS — J329 Chronic sinusitis, unspecified: Secondary | ICD-10-CM

## 2012-06-26 DIAGNOSIS — H6690 Otitis media, unspecified, unspecified ear: Secondary | ICD-10-CM

## 2012-06-26 DIAGNOSIS — H6693 Otitis media, unspecified, bilateral: Secondary | ICD-10-CM

## 2012-06-26 MED ORDER — METHYLPREDNISOLONE ACETATE 80 MG/ML IJ SUSP
80.0000 mg | Freq: Once | INTRAMUSCULAR | Status: AC
  Administered 2012-06-26: 80 mg via INTRAMUSCULAR

## 2012-06-26 NOTE — Progress Notes (Signed)
  Subjective:    Patient ID: Tammy Farley, female    DOB: 1961/04/15, 52 y.o.   MRN: [id number]  HPI upper respiratory and sinus infection symptoms since around Thanksgiving 2013. Tried to do  Netty pot and treat with over-the-counter medications but has not gotten better. No fever or shaking chills. Slight cough. Nasally congested and has fullness in her head. Feels ears are stopped up.      Review of Systems     Objective:   Physical Exam TMs are full bilaterally and pink. Pharynx slightly injected. Sounds nasally congested. Has boggy nasal mucosa. Neck is supple without adenopathy. Chest clear to auscultation.        Assessment & Plan:  Sinusitis  Bilateral serous otitis media  Plan: Depo-Medrol 80 mg IM. Levaquin 500 milligrams daily for 7 days with one refill & if not better in 7 days should have prescription refilled. Declines offer for cough medication.

## 2012-06-29 ENCOUNTER — Other Ambulatory Visit: Payer: Self-pay | Admitting: Internal Medicine

## 2012-06-29 NOTE — Telephone Encounter (Signed)
Please refill.

## 2012-07-02 ENCOUNTER — Telehealth: Payer: Self-pay | Admitting: Internal Medicine

## 2012-07-02 ENCOUNTER — Other Ambulatory Visit: Payer: Self-pay

## 2012-07-02 MED ORDER — HYDROCODONE-ACETAMINOPHEN 2.5-325 MG PO TABS: 5.0000 mg | ORAL_TABLET | Freq: Three times a day (TID) | ORAL | Status: DC | PRN

## 2012-07-02 NOTE — Telephone Encounter (Signed)
Spoke w/patient and advised that per Dr. Lenord Fellers, please finish current Rx of Levaquin and then refill and finish that completely.  If she is NOT better by next week, patient instructed to call and schedule appointment.  Patient verbalizes understanding.

## 2012-07-02 NOTE — Telephone Encounter (Signed)
Refill Levaquin and return if no better next week.

## 2012-07-23 ENCOUNTER — Telehealth: Payer: Self-pay

## 2012-07-23 DIAGNOSIS — J329 Chronic sinusitis, unspecified: Secondary | ICD-10-CM

## 2012-07-23 NOTE — Telephone Encounter (Signed)
Sinus films then office visit

## 2012-07-23 NOTE — Telephone Encounter (Signed)
Patient informed. Will order plain sinus films and then have her see Dr. Lenord Fellers at 11:15am.

## 2012-07-23 NOTE — Telephone Encounter (Signed)
Patient  States she has had 2 rounds of antibiotics for a sinus infection, but thinks she still has one. Head hurts, cough, congestion, could not go to work today. What to do.

## 2012-07-24 ENCOUNTER — Ambulatory Visit (INDEPENDENT_AMBULATORY_CARE_PROVIDER_SITE_OTHER): Payer: BC Managed Care – PPO | Admitting: Internal Medicine

## 2012-07-24 ENCOUNTER — Ambulatory Visit
Admission: RE | Admit: 2012-07-24 | Discharge: 2012-07-24 | Disposition: A | Discharge status: Home / Self Care | Payer: BC Managed Care – PPO | Source: Ambulatory Visit | Attending: Internal Medicine | Admitting: Internal Medicine

## 2012-07-24 ENCOUNTER — Encounter: Payer: Self-pay | Admitting: Internal Medicine

## 2012-07-24 VITALS — BP 120/92 | HR 76 | Temp 98.7°F | Wt 176.0 lb

## 2012-07-24 DIAGNOSIS — H6592 Unspecified nonsuppurative otitis media, left ear: Secondary | ICD-10-CM

## 2012-07-24 DIAGNOSIS — J309 Allergic rhinitis, unspecified: Secondary | ICD-10-CM

## 2012-07-24 DIAGNOSIS — J40 Bronchitis, not specified as acute or chronic: Secondary | ICD-10-CM

## 2012-07-24 DIAGNOSIS — H6590 Unspecified nonsuppurative otitis media, unspecified ear: Secondary | ICD-10-CM

## 2012-07-24 NOTE — Patient Instructions (Addendum)
You have an appointment with Dr. Irena Cords on 07/31/2012 at 10:45 am. Instructions given. Take doxycycline 100 mg twice daily for 10 days. Take Diflucan if needed for candida vaginitis. Flonase nasal spray 2 sprays in each nostril daily. Zyrtec-D daily. Keep allergy appointment February 11

## 2012-07-24 NOTE — Progress Notes (Signed)
  Subjective:    Patient ID: Tammy Farley, female    DOB: 1960-10-08, 52 y.o.   MRN: [id number]  HPIWas here January 7th treated with Levaquin and Depomedrol. Subsequently patient called back and Levaquin was refilled. Had discolored drainage out of nose with one nosebleed and discolored sputum. Still has occasional cough. Had discolored sputum this am once. Complaint  of excruciating left occipital headache when coughing for brief second or two.  No fever but complains of chills. Never had allergy testing. Refuses steroid dose pack because of bloating and weight gain. Says Depomedrol caused fluid retention. Cannot breathe out of nostrils. Complains of left maxillary sinus tenderness. Had Sinus X-rays done this am which are negative. Has been taking some OTC antihistamine the name of which she cannot recall.   Review of Systems     Objective:   Physical Exam Very boggy nasal mucosa bilaterally with obstruction bilaterally. Pharynx is clear. TMs are clear. Left TM is full but not red. Neck is supple. Chest is clear.        Assessment & Plan:  Left serous otitis media  Probable allergic rhinitis  Bronchitis  Plan: Doxycycline 100 mg twice daily for 10 days. Flonase nasal spray 2 sprays in each nostril daily. Zyrtec-D daily. Diflucan 150 mg tablet with one refill to take should she get Candida vaginitis while on doxycycline. Allergy consultation arranged for February 11.

## 2012-08-26 ENCOUNTER — Encounter: Payer: Self-pay | Admitting: Internal Medicine

## 2012-08-26 NOTE — Patient Instructions (Addendum)
Take Levaquin 500 milligrams daily for 7 days. If not better have prescription refilled. Depo-Medrol given today.

## 2012-09-11 ENCOUNTER — Other Ambulatory Visit: Payer: BC Managed Care – PPO | Admitting: Internal Medicine

## 2012-09-13 ENCOUNTER — Encounter: Payer: BC Managed Care – PPO | Admitting: Internal Medicine

## 2012-09-26 ENCOUNTER — Other Ambulatory Visit: Payer: Self-pay | Admitting: Internal Medicine

## 2012-09-27 ENCOUNTER — Other Ambulatory Visit: Payer: Self-pay

## 2012-10-09 ENCOUNTER — Other Ambulatory Visit: Payer: BC Managed Care – PPO | Admitting: Internal Medicine

## 2012-10-09 DIAGNOSIS — Z1329 Encounter for screening for other suspected endocrine disorder: Secondary | ICD-10-CM

## 2012-10-09 DIAGNOSIS — R82998 Other abnormal findings in urine: Secondary | ICD-10-CM

## 2012-10-09 DIAGNOSIS — E785 Hyperlipidemia, unspecified: Secondary | ICD-10-CM

## 2012-10-09 DIAGNOSIS — E559 Vitamin D deficiency, unspecified: Secondary | ICD-10-CM

## 2012-10-09 DIAGNOSIS — Z79899 Other long term (current) drug therapy: Secondary | ICD-10-CM

## 2012-10-09 DIAGNOSIS — R300 Dysuria: Secondary | ICD-10-CM

## 2012-10-09 LAB — CBC WITH DIFFERENTIAL/PLATELET
Eosinophils Absolute: 0.1 10*3/uL (ref 0.0–0.7)
Lymphocytes Relative: 37 % (ref 12–46)
Lymphs Abs: 2.2 10*3/uL (ref 0.7–4.0)
MCH: 29.5 pg (ref 26.0–34.0)
Neutrophils Relative %: 54 % (ref 43–77)
Platelets: 220 10*3/uL (ref 150–400)
RBC: 4.51 MIL/uL (ref 3.87–5.11)
WBC: 5.9 10*3/uL (ref 4.0–10.5)

## 2012-10-09 LAB — COMPREHENSIVE METABOLIC PANEL
Albumin: 4.8 g/dL (ref 3.5–5.2)
CO2: 24 mEq/L (ref 19–32)
Chloride: 106 mEq/L (ref 96–112)
Glucose, Bld: 87 mg/dL (ref 70–99)
Potassium: 4.1 mEq/L (ref 3.5–5.3)
Sodium: 142 mEq/L (ref 135–145)
Total Protein: 6.9 g/dL (ref 6.0–8.3)

## 2012-10-09 LAB — POCT URINALYSIS DIPSTICK
Blood, UA: NEGATIVE
Glucose, UA: NEGATIVE
Nitrite, UA: POSITIVE
Urobilinogen, UA: NEGATIVE
pH, UA: 6

## 2012-10-09 LAB — TSH: TSH: 1.921 u[IU]/mL (ref 0.350–4.500)

## 2012-10-09 NOTE — Addendum Note (Signed)
Addended by: Judy Pimple on: 10/09/2012 10:23 AM   Modules accepted: Orders

## 2012-10-10 LAB — VITAMIN D 25 HYDROXY (VIT D DEFICIENCY, FRACTURES): Vit D, 25-Hydroxy: 31 ng/mL (ref 30–89)

## 2012-10-11 ENCOUNTER — Ambulatory Visit (INDEPENDENT_AMBULATORY_CARE_PROVIDER_SITE_OTHER): Payer: BC Managed Care – PPO | Admitting: Internal Medicine

## 2012-10-11 ENCOUNTER — Encounter: Payer: Self-pay | Admitting: Internal Medicine

## 2012-10-11 VITALS — BP 106/76 | HR 92 | Temp 99.3°F | Ht 63.5 in | Wt 163.0 lb

## 2012-10-11 DIAGNOSIS — IMO0001 Reserved for inherently not codable concepts without codable children: Secondary | ICD-10-CM

## 2012-10-11 DIAGNOSIS — E785 Hyperlipidemia, unspecified: Secondary | ICD-10-CM

## 2012-10-11 DIAGNOSIS — N390 Urinary tract infection, site not specified: Secondary | ICD-10-CM

## 2012-10-11 DIAGNOSIS — Z8669 Personal history of other diseases of the nervous system and sense organs: Secondary | ICD-10-CM

## 2012-10-11 DIAGNOSIS — M797 Fibromyalgia: Secondary | ICD-10-CM

## 2012-10-11 DIAGNOSIS — Z0000 Encounter for general adult medical examination without abnormal findings: Secondary | ICD-10-CM

## 2012-10-11 DIAGNOSIS — Z8719 Personal history of other diseases of the digestive system: Secondary | ICD-10-CM

## 2012-10-11 DIAGNOSIS — J329 Chronic sinusitis, unspecified: Secondary | ICD-10-CM

## 2012-10-11 LAB — URINE CULTURE: Colony Count: >=100000

## 2012-10-18 ENCOUNTER — Encounter: Payer: Self-pay | Admitting: Internal Medicine

## 2012-10-18 ENCOUNTER — Other Ambulatory Visit (INDEPENDENT_AMBULATORY_CARE_PROVIDER_SITE_OTHER): Payer: BC Managed Care – PPO | Admitting: Internal Medicine

## 2012-10-18 VITALS — Temp 98.8°F

## 2012-10-18 DIAGNOSIS — N390 Urinary tract infection, site not specified: Secondary | ICD-10-CM

## 2012-10-18 LAB — POCT URINALYSIS DIPSTICK
Bilirubin, UA: NEGATIVE
Glucose, UA: NEGATIVE
Leukocytes, UA: NEGATIVE
Nitrite, UA: NEGATIVE
Urobilinogen, UA: NEGATIVE

## 2012-10-18 NOTE — Patient Instructions (Addendum)
Urine has cleared status post treatment for urinary tract infection. Return when necessary

## 2012-10-18 NOTE — Addendum Note (Signed)
Addended by: Margaree Mackintosh on: 10/18/2012 03:30 PM   Modules accepted: Medications, Level of Service

## 2012-10-18 NOTE — Progress Notes (Signed)
  Subjective:    Patient ID: Tammy Farley, female    DOB: October 09, 1960, 52 y.o.   MRN: [id number]  HPI followup today on recent urinary tract infection. Has been on antibiotics. Symptoms have improved.    Review of Systems     Objective:   Physical Exam urinalysis today within normal limits        Assessment & Plan:  UTI resolved  Plan: Return when necessary

## 2012-11-27 ENCOUNTER — Other Ambulatory Visit: Payer: Self-pay | Admitting: Internal Medicine

## 2012-12-25 ENCOUNTER — Telehealth: Payer: Self-pay

## 2012-12-25 DIAGNOSIS — L255 Unspecified contact dermatitis due to plants, except food: Secondary | ICD-10-CM

## 2012-12-25 MED ORDER — PREDNISONE (PAK) 10 MG PO TABS: 10.0000 mg | ORAL_TABLET | Freq: Every day | ORAL | Status: DC

## 2012-12-25 NOTE — Telephone Encounter (Signed)
Patient calls today stating she has been treating herself for poison ivy for about a week now, but it just continues to spread. Verbal OK per Dr. Lenord Fellers for a 6 day Prednisone dosepak, to be sent to [facility] in [facility].

## 2012-12-26 ENCOUNTER — Other Ambulatory Visit: Payer: Self-pay

## 2012-12-30 NOTE — Progress Notes (Signed)
Subjective:    Patient ID: Tammy Farley, female    DOB: 07-19-1960, 52 y.o.   MRN: [id number]  HPI 52 year old female with history of hyperlipidemia, anxiety depression, fibromyalgia syndrome, diverticulitis, vitamin D deficiency, impingement right shoulder, allergic rhinitis for health maintenance and evaluation of medical problems. Takes narcotic pain medication to control pain with fibromyalgia. Is on Lipitor and Wellbutrin. History of migraine headaches.  No known drug allergies  Social history: This is her second marriage. Husband is employed in [employer] which he owns and operates. She travels with him quite a bit. She is of [place] descent.  She has tried Lyrica, Cymbalta, Neurontin, amitriptyline in the past for headache control and says none of these have worked. She is currently on Topamax.  History of recurrent urinary infections.  Hospitalized with diverticulitis around 2004 2005 that [facility]. Hysterectomy in 2000. No known drug allergies.  Social history: Has a son and 2 daughters from a previous marriage. Does not smoke or consume alcohol. Completed high school. Formerly was in [employer]  Family history: Mother died with pancreatic cancer. Father living in good health. One brother and one sister in good health.  Had another bout of diverticulitis February 2012. Had colonoscopy by Dr. Juanda Chance in 2011.  Had tetanus immunization 2011.    Review of Systems  Constitutional: Positive for fatigue.  HENT:       Allergic rhinitis and recurrent sinusitis  Eyes: Negative.   Respiratory: Negative.   Cardiovascular: Negative.   Gastrointestinal:       History of diverticulitis  Endocrine: Negative.   Genitourinary:       History of recurrent urinary infections  Musculoskeletal:       History of fibromyalgia syndrome  Allergic/Immunologic: Positive for environmental allergies.  Neurological:       History of migraine headaches   Hematological: Negative.   Psychiatric/Behavioral: Negative.        Objective:   Physical Exam  Vitals reviewed. Constitutional: She appears well-developed and well-nourished. No distress.  HENT:  Head: Normocephalic and atraumatic.  Right Ear: External ear normal.  Left Ear: External ear normal.  Mouth/Throat: Oropharynx is clear and moist. No oropharyngeal exudate.  Eyes: Conjunctivae and EOM are normal. Pupils are equal, round, and reactive to light. Right eye exhibits no discharge. Left eye exhibits no discharge. No scleral icterus.  Neck: Neck supple. No JVD present. No thyromegaly present.  Cardiovascular: Normal rate, regular rhythm, normal heart sounds and intact distal pulses.   No murmur heard. Pulmonary/Chest: Effort normal and breath sounds normal. No respiratory distress. She has no wheezes. She has no rales. She exhibits no tenderness.  Breasts normal female  Abdominal: Soft. Bowel sounds are normal. She exhibits no distension and no mass. There is no tenderness. There is no rebound and no guarding.  Genitourinary:  Status post hysterectomy  Musculoskeletal: Normal range of motion. She exhibits no edema.  Lymphadenopathy:    She has no cervical adenopathy.  Neurological: She is alert. She has normal reflexes. No cranial nerve deficit. Coordination normal.  Skin: Skin is warm and dry. No rash noted. She is not diaphoretic.  Psychiatric: She has a normal mood and affect. Her behavior is normal. Judgment and thought content normal.          Assessment & Plan:  History of fibromyalgia syndrome-stable with medication  History of migraine headaches-treated with Topamax and when necessary pain medication  History of recurrent sinusitis  History of diverticulitis  History of recurrent  urinary tract infections  Hyperlipidemia-on treatment  Plan: Return in 6 months. Continue current medications.

## 2012-12-30 NOTE — Patient Instructions (Addendum)
Continue same medications and return in 6 months. Discussed allergy testing

## 2012-12-31 ENCOUNTER — Other Ambulatory Visit: Payer: Self-pay | Admitting: Internal Medicine

## 2013-01-29 ENCOUNTER — Other Ambulatory Visit: Payer: Self-pay | Admitting: Internal Medicine

## 2013-01-30 ENCOUNTER — Other Ambulatory Visit: Payer: Self-pay

## 2013-01-30 MED ORDER — DIAZEPAM 5 MG PO TABS: 5.0000 mg | ORAL_TABLET | Freq: Two times a day (BID) | ORAL | Status: DC | PRN

## 2013-02-13 ENCOUNTER — Telehealth: Payer: Self-pay | Admitting: Internal Medicine

## 2013-02-14 ENCOUNTER — Encounter: Payer: Self-pay | Admitting: Internal Medicine

## 2013-02-14 ENCOUNTER — Ambulatory Visit (INDEPENDENT_AMBULATORY_CARE_PROVIDER_SITE_OTHER): Payer: BC Managed Care – PPO | Admitting: Internal Medicine

## 2013-02-14 VITALS — BP 116/84 | HR 84 | Temp 99.1°F | Wt 169.0 lb

## 2013-02-14 DIAGNOSIS — R109 Unspecified abdominal pain: Secondary | ICD-10-CM

## 2013-02-14 DIAGNOSIS — R82998 Other abnormal findings in urine: Secondary | ICD-10-CM

## 2013-02-14 DIAGNOSIS — K5732 Diverticulitis of large intestine without perforation or abscess without bleeding: Secondary | ICD-10-CM

## 2013-02-14 DIAGNOSIS — S8012XA Contusion of left lower leg, initial encounter: Secondary | ICD-10-CM

## 2013-02-14 DIAGNOSIS — K5900 Constipation, unspecified: Secondary | ICD-10-CM

## 2013-02-14 DIAGNOSIS — K5792 Diverticulitis of intestine, part unspecified, without perforation or abscess without bleeding: Secondary | ICD-10-CM

## 2013-02-14 LAB — CBC WITH DIFFERENTIAL/PLATELET
Basophils Absolute: 0 10*3/uL (ref 0.0–0.1)
Basophils Relative: 0 % (ref 0–1)
Eosinophils Relative: 3 % (ref 0–5)
HCT: 38.5 % (ref 36.0–46.0)
MCHC: 33.8 g/dL (ref 30.0–36.0)
MCV: 85.6 fL (ref 78.0–100.0)
Monocytes Absolute: 0.3 10*3/uL (ref 0.1–1.0)
Neutro Abs: 2.6 10*3/uL (ref 1.7–7.7)
Platelets: 365 10*3/uL (ref 150–400)
RDW: 12.6 % (ref 11.5–15.5)

## 2013-02-14 LAB — POCT URINALYSIS DIPSTICK
Glucose, UA: NEGATIVE
Nitrite, UA: NEGATIVE
Protein, UA: NEGATIVE
Urobilinogen, UA: NEGATIVE

## 2013-02-14 MED ORDER — METRONIDAZOLE 500 MG PO TABS: 500.0000 mg | ORAL_TABLET | Freq: Three times a day (TID) | ORAL | Status: DC

## 2013-02-14 MED ORDER — LUBIPROSTONE 24 MCG PO CAPS: 24.0000 ug | ORAL_CAPSULE | Freq: Two times a day (BID) | ORAL | Status: DC

## 2013-02-14 MED ORDER — CIPROFLOXACIN HCL 500 MG PO TABS: 500.0000 mg | ORAL_TABLET | Freq: Two times a day (BID) | ORAL | Status: DC

## 2013-02-14 NOTE — Telephone Encounter (Signed)
Appt given for TODAY at 2:45 -- patient lives in [place].  Patient states she's feeling better today.  Advised Dr. Lenord Fellers would like to see her.

## 2013-02-14 NOTE — Telephone Encounter (Signed)
She needs to be seen right away.

## 2013-02-14 NOTE — Patient Instructions (Addendum)
Have CT of abdomen and pelvis tomorrow along with Doppler of Left leg. CBC is pending. Clear liquids and advance diet slowly. Once diverticulitis has resolved in about 3 weeks re-start Amitiza 24 mcg twice daily.

## 2013-02-15 ENCOUNTER — Ambulatory Visit (HOSPITAL_COMMUNITY): Payer: BC Managed Care – PPO

## 2013-02-15 ENCOUNTER — Other Ambulatory Visit: Payer: BC Managed Care – PPO

## 2013-02-15 ENCOUNTER — Ambulatory Visit
Admission: RE | Admit: 2013-02-15 | Discharge: 2013-02-15 | Disposition: A | Discharge status: Home / Self Care | Payer: BC Managed Care – PPO | Source: Ambulatory Visit | Attending: Internal Medicine | Admitting: Internal Medicine

## 2013-02-15 ENCOUNTER — Ambulatory Visit (HOSPITAL_COMMUNITY)
Admission: RE | Admit: 2013-02-15 | Discharge: 2013-02-15 | Disposition: A | Discharge status: Home / Self Care | Payer: BC Managed Care – PPO | Source: Ambulatory Visit | Attending: Internal Medicine | Admitting: Internal Medicine

## 2013-02-15 ENCOUNTER — Other Ambulatory Visit (HOSPITAL_COMMUNITY): Payer: Self-pay | Admitting: Internal Medicine

## 2013-02-15 DIAGNOSIS — S8011XD Contusion of right lower leg, subsequent encounter: Secondary | ICD-10-CM

## 2013-02-15 DIAGNOSIS — X58XXXA Exposure to other specified factors, initial encounter: Secondary | ICD-10-CM | POA: Insufficient documentation

## 2013-02-15 DIAGNOSIS — S8010XA Contusion of unspecified lower leg, initial encounter: Secondary | ICD-10-CM | POA: Insufficient documentation

## 2013-02-15 DIAGNOSIS — M79609 Pain in unspecified limb: Secondary | ICD-10-CM

## 2013-02-15 MED ORDER — IOHEXOL 300 MG/ML  SOLN
100.0000 mL | Freq: Once | INTRAMUSCULAR | Status: AC | PRN
  Administered 2013-02-15: 100 mL via INTRAVENOUS

## 2013-02-15 NOTE — Progress Notes (Signed)
Left lower extremity venous duplex completed.  Left:  No evidence of DVT, superficial thrombosis, or Baker's cyst.  Right:  Negative for DVT in the common femoral vein.  

## 2013-02-15 NOTE — Progress Notes (Signed)
Patient informed. 

## 2013-02-16 DIAGNOSIS — K5900 Constipation, unspecified: Secondary | ICD-10-CM | POA: Insufficient documentation

## 2013-02-16 LAB — URINE CULTURE: Colony Count: >=100000

## 2013-02-16 NOTE — Progress Notes (Signed)
  Subjective:    Patient ID: Tammy Farley, female    DOB: 1961/03/16, 52 y.o.   MRN: [id number]  HPI Patient called yesterday complaining of recurrent left lower caught of abdominal pain. She does have a history of diverticulitis. Has not had an episode of diverticulitis since 2012. She does have issues with constipation because she takes narcotic pain medication for fibromyalgia syndrome. She was prescribed Amitiza per Dr. Juanda Chance but quit taking it. Patient has had symptoms of left lower quadrant pain for approximately 4 days. No fever or shaking chills. Also think she has a urinary tract infection. Urinalysis is abnormal. Culture was taken. Also says that about a month ago she fell on a boat in [facility]. She struck her left leg and it's been painful ever since. Says she has had considerable bruising of the left anterior leg which is slow to resolve.    Review of Systems     Objective:   Physical Exam superficial varicosities and resolving hematoma left anterior lower extremity. Abdomen is soft obese nondistended. Bowel sounds are slightly increased. No hepatosplenomegaly or masses. She has left lower quadrant abdominal tenderness without rebound. Skin is warm and dry. Chest is clear to auscultation. Cardiac exam regular rate and rhythm. Urinalysis is abnormal. Culture was taken.        Assessment & Plan:  Acute diverticulitis-patient will be started on Cipro 500 mg twice daily for 10 days and Flagyl 500 mg twice daily for 7 days. Is to start on clear liquids and advance diet slowly. Is to have CT of abdomen and pelvis tomorrow. In about 3 weeks restart Amitiza.  UTI- hopefully Cipro will cover urinary tract infection. Culture is pending.  Hematoma left anterior leg. Rule out DVT. Patient is to have Doppler study of the left lower extremity tomorrow.  Addendum: 02/15/2013 CT of the abdomen and pelvis consistent with diverticulitis, no DVT noted, CBC is within normal  limits.  Addendum 02/16/2013: Patient does have urinary tract infection. Organism is sensitive to Cipro.  25 minutes spent with patient

## 2013-02-19 NOTE — Progress Notes (Signed)
Patient informed. 

## 2013-02-20 NOTE — Progress Notes (Signed)
Patient informed. 

## 2013-04-02 ENCOUNTER — Other Ambulatory Visit: Payer: Self-pay | Admitting: *Deleted

## 2013-04-02 MED ORDER — HYDROCODONE-ACETAMINOPHEN 5-325 MG PO TABS
1.0000 | ORAL_TABLET | Freq: Three times a day (TID) | ORAL | Status: DC | PRN
Start: 2013-04-02 — End: 2013-04-02

## 2013-04-02 MED ORDER — HYDROCODONE-ACETAMINOPHEN 5-325 MG PO TABS: 1.0000 | ORAL_TABLET | Freq: Three times a day (TID) | ORAL | Status: DC | PRN

## 2013-04-08 ENCOUNTER — Other Ambulatory Visit: Payer: BC Managed Care – PPO | Admitting: Internal Medicine

## 2013-04-08 DIAGNOSIS — E785 Hyperlipidemia, unspecified: Secondary | ICD-10-CM

## 2013-04-08 DIAGNOSIS — Z79899 Other long term (current) drug therapy: Secondary | ICD-10-CM

## 2013-04-08 LAB — LIPID PANEL
HDL: 66 mg/dL (ref >39)
LDL Cholesterol: 100 mg/dL — ABNORMAL HIGH (ref 0–99)
VLDL: 26 mg/dL (ref 0–40)

## 2013-04-08 LAB — HEPATIC FUNCTION PANEL
Albumin: 4.9 g/dL (ref 3.5–5.2)
Alkaline Phosphatase: 53 U/L (ref 39–117)
Indirect Bilirubin: 0.4 mg/dL (ref 0.0–0.9)
Total Bilirubin: 0.5 mg/dL (ref 0.3–1.2)
Total Protein: 7.4 g/dL (ref 6.0–8.3)

## 2013-04-09 ENCOUNTER — Ambulatory Visit (INDEPENDENT_AMBULATORY_CARE_PROVIDER_SITE_OTHER): Payer: BC Managed Care – PPO | Admitting: Internal Medicine

## 2013-04-09 ENCOUNTER — Encounter: Payer: Self-pay | Admitting: Internal Medicine

## 2013-04-09 VITALS — BP 114/78 | HR 72 | Temp 98.3°F | Ht 63.0 in | Wt 172.0 lb

## 2013-04-09 DIAGNOSIS — IMO0001 Reserved for inherently not codable concepts without codable children: Secondary | ICD-10-CM

## 2013-04-09 DIAGNOSIS — E785 Hyperlipidemia, unspecified: Secondary | ICD-10-CM

## 2013-04-09 DIAGNOSIS — Z8719 Personal history of other diseases of the digestive system: Secondary | ICD-10-CM

## 2013-04-09 DIAGNOSIS — M797 Fibromyalgia: Secondary | ICD-10-CM

## 2013-04-09 DIAGNOSIS — N390 Urinary tract infection, site not specified: Secondary | ICD-10-CM

## 2013-04-09 DIAGNOSIS — Z8669 Personal history of other diseases of the nervous system and sense organs: Secondary | ICD-10-CM

## 2013-04-09 MED ORDER — ESTROGENS, CONJUGATED 0.625 MG/GM VA CREA: TOPICAL_CREAM | Freq: Every day | VAGINAL | Status: DC

## 2013-04-09 NOTE — Progress Notes (Signed)
  Subjective:    Patient ID: Tammy Farley, female    DOB: 04/27/1961, 52 y.o.   MRN: [id number]  HPI  52 year old White female for 6 month recheck. She has history of migraine headaches, fibromyalgia syndrome, recurrent urinary tract infections, recurrent sinusitis, hyperlipidemia and recurrent diverticulitis of the colon. She has tried Lyrica, Cymbalta, Neurontin, amitriptyline in the past for headache control and none of these have worked. She currently is on Topamax. Hospitalized for diverticulitis approximately 2005 and [facility]. Had recurrent diverticulitis February 2012. Colonoscopy with Dr. Juanda Chance in 2011.    Review of Systems     Objective:   Physical Exam neck is supple without JVD thyromegaly or carotid bruits. Chest clear to auscultation. Cardiac exam regular rate and rhythm normal S1 and S2. Extremities without edema        Assessment & Plan:  Fibromyalgia syndrome-requires considerable narcotic medication to control  History of migraine headaches-on Topamax 75 mg daily and when necessary narcotic medication  History of depression treated with Wellbutrin  Hyperlipidemia treated with Lipitor 10 mg daily and lipid panel is stable. History of recurrent diverticulitis  History of recurrent sinusitis  Plan: Return in 6 months for  physical examination and fasting lab work.

## 2013-05-22 ENCOUNTER — Ambulatory Visit (INDEPENDENT_AMBULATORY_CARE_PROVIDER_SITE_OTHER): Payer: BC Managed Care – PPO | Admitting: Internal Medicine

## 2013-05-22 ENCOUNTER — Encounter: Payer: Self-pay | Admitting: Internal Medicine

## 2013-05-22 VITALS — BP 118/78 | HR 68 | Temp 98.8°F | Ht 62.0 in | Wt 176.0 lb

## 2013-05-22 DIAGNOSIS — J029 Acute pharyngitis, unspecified: Secondary | ICD-10-CM

## 2013-05-22 LAB — POCT RAPID STREP A (OFFICE): Rapid Strep A Screen: NEGATIVE

## 2013-05-22 MED ORDER — LEVOFLOXACIN 500 MG PO TABS: 500.0000 mg | ORAL_TABLET | Freq: Every day | ORAL | Status: DC

## 2013-07-02 ENCOUNTER — Telehealth: Payer: Self-pay | Admitting: Internal Medicine

## 2013-07-02 NOTE — Telephone Encounter (Signed)
Please refill once 

## 2013-07-03 ENCOUNTER — Other Ambulatory Visit: Payer: Self-pay

## 2013-07-03 MED ORDER — HYDROCODONE-ACETAMINOPHEN 5-325 MG PO TABS: 1.0000 | ORAL_TABLET | Freq: Three times a day (TID) | ORAL | Status: DC | PRN

## 2013-07-04 ENCOUNTER — Other Ambulatory Visit: Payer: Self-pay | Admitting: Internal Medicine

## 2013-07-04 NOTE — Telephone Encounter (Signed)
Says authorization required? Refill x one year.

## 2013-07-05 ENCOUNTER — Other Ambulatory Visit: Payer: Self-pay

## 2013-07-29 ENCOUNTER — Telehealth: Payer: Self-pay | Admitting: Internal Medicine

## 2013-07-29 ENCOUNTER — Other Ambulatory Visit: Payer: Self-pay

## 2013-07-29 MED ORDER — HYDROCODONE-ACETAMINOPHEN 5-325 MG PO TABS: 1.0000 | ORAL_TABLET | Freq: Three times a day (TID) | ORAL | Status: DC | PRN

## 2013-07-29 NOTE — Telephone Encounter (Signed)
Please refill.

## 2013-08-15 NOTE — Patient Instructions (Signed)
Continue same medications and return in 6 months 

## 2013-08-27 ENCOUNTER — Ambulatory Visit (INDEPENDENT_AMBULATORY_CARE_PROVIDER_SITE_OTHER): Payer: BC Managed Care – PPO | Admitting: Internal Medicine

## 2013-08-27 ENCOUNTER — Encounter: Payer: Self-pay | Admitting: Internal Medicine

## 2013-08-27 VITALS — BP 120/78 | HR 84 | Temp 99.6°F | Wt 180.0 lb

## 2013-08-27 DIAGNOSIS — N390 Urinary tract infection, site not specified: Secondary | ICD-10-CM

## 2013-08-27 DIAGNOSIS — Z87440 Personal history of urinary (tract) infections: Secondary | ICD-10-CM

## 2013-08-27 DIAGNOSIS — R300 Dysuria: Secondary | ICD-10-CM

## 2013-08-27 DIAGNOSIS — Z8669 Personal history of other diseases of the nervous system and sense organs: Secondary | ICD-10-CM

## 2013-08-27 DIAGNOSIS — R635 Abnormal weight gain: Secondary | ICD-10-CM

## 2013-08-27 LAB — POCT URINALYSIS DIPSTICK
BILIRUBIN UA: NEGATIVE
Glucose, UA: NEGATIVE
Ketones, UA: NEGATIVE
Nitrite, UA: POSITIVE
Protein, UA: NEGATIVE
RBC UA: NEGATIVE
Spec Grav, UA: 1.02
Urobilinogen, UA: NEGATIVE
pH, UA: 5.5

## 2013-08-27 MED ORDER — LEVOFLOXACIN 500 MG PO TABS: 500.0000 mg | ORAL_TABLET | Freq: Every day | ORAL | Status: DC

## 2013-08-27 MED ORDER — ALBUTEROL SULFATE HFA 108 (90 BASE) MCG/ACT IN AERS: 2.0000 | INHALATION_SPRAY | Freq: Four times a day (QID) | RESPIRATORY_TRACT | Status: DC | PRN

## 2013-08-27 MED ORDER — TOPIRAMATE 25 MG PO TABS: ORAL_TABLET | ORAL | Status: DC

## 2013-08-27 MED ORDER — HYDROCODONE-HOMATROPINE 5-1.5 MG/5ML PO SYRP: 5.0000 mL | ORAL_SOLUTION | Freq: Three times a day (TID) | ORAL | Status: DC | PRN

## 2013-08-27 NOTE — Patient Instructions (Addendum)
Topamax to be refilled. Please restart this. Diet and exercise encouraged. Take Levaquin 500 milligrams daily for 10 days. Use albuterol inhaler as needed for wheezing. Take Hycodan sparingly for cough.

## 2013-08-27 NOTE — Progress Notes (Signed)
   Subjective:    Patient ID: Tammy Farley, female    DOB: [birth year], 53 y.o.   MRN: [id number]  HPI Patient in today with acute respiratory infection. Says her ears are hurting. Says she has tickle in her throat and cough that is nonproductive. No fever or chills. Think she's been wheezing at night. Does not have inhaler.  Patient brings up another problem. She has dysuria. No back pain fever or chills.  Patient says that she stopped taking Topamax and have recurrence of headaches. Wants to know what to do. She needs to restart Topamax.  Weight gain: Patient has not had much luck with weight watchers. She's concerned about what she should do. Wants to try bel date. Told her I was not comfortable prescribing this medication at this point in time because it is relatively new and we do not know what long-term effects are. May not  interact well with Topamax.  Urinalysis today is abnormal. Culture was obtained.    Review of Systems     Objective:   Physical Exam No CVA tenderness. HEENT exam: Pharynx slightly injected without exudate. TMs are full and slightly pink but not red. Neck is supple. Chest clear to auscultation.       Assessment & Plan:  Acute URI  Acute UTI-urine culture pending  Weight gain-needs to diet and exercise. Weight in 2012 was 174 pounds and is now 180 pounds. Suspect she needs to exercise to increase metabolism. Says she is unable to do this often because of history of fibromyalgia syndrome.  Migraine headaches-needs to restart Topamax  Plan: Encouraged diet and exercise. Levaquin 500 milligrams daily for 10 days. Hycodan 8 ounces 1 teaspoon by mouth every 6 hours when necessary cough. Albuterol inhaler 2 sprays by mouth 4 times a day when necessary wheezing. Refill Topamax 25 mg-take 3 tablets daily.

## 2013-08-29 ENCOUNTER — Other Ambulatory Visit: Payer: Self-pay

## 2013-08-29 LAB — URINE CULTURE

## 2013-08-29 MED ORDER — AMOXICILLIN 500 MG PO CAPS: 500.0000 mg | ORAL_CAPSULE | Freq: Three times a day (TID) | ORAL | Status: DC

## 2013-08-29 NOTE — Progress Notes (Signed)
Patient informed. She is to stop Levaquin and start Amoxicillin. Sent to [facility] [place]

## 2013-09-10 ENCOUNTER — Encounter: Payer: Self-pay | Admitting: Internal Medicine

## 2013-09-10 ENCOUNTER — Other Ambulatory Visit (INDEPENDENT_AMBULATORY_CARE_PROVIDER_SITE_OTHER): Payer: BC Managed Care – PPO | Admitting: Internal Medicine

## 2013-09-10 VITALS — Temp 99.6°F

## 2013-09-10 DIAGNOSIS — N390 Urinary tract infection, site not specified: Secondary | ICD-10-CM

## 2013-09-10 LAB — POCT URINALYSIS DIPSTICK
BILIRUBIN UA: NEGATIVE
Blood, UA: NEGATIVE
Glucose, UA: NEGATIVE
Ketones, UA: NEGATIVE
LEUKOCYTES UA: NEGATIVE
Nitrite, UA: NEGATIVE
Protein, UA: NEGATIVE
Spec Grav, UA: 1.015
Urobilinogen, UA: NEGATIVE
pH, UA: 6

## 2013-09-10 NOTE — Progress Notes (Signed)
Patient here for UTI recheck. Urine clear. Patient asymptomatic.

## 2013-10-27 ENCOUNTER — Encounter: Payer: Self-pay | Admitting: Internal Medicine

## 2013-10-27 NOTE — Patient Instructions (Signed)
Take amoxicillin 500 mg 3 times daily for 10 days

## 2013-10-27 NOTE — Progress Notes (Signed)
   Subjective:    Patient ID: Tammy Farley, female    DOB: June 14, 1961, 53 y.o.   MRN: [id number]  HPI In today with acute sore throat and URI symptoms. She is afebrile. Is in considerable amount discomfort from sore throat. Seen on an urgent basis today.    Review of Systems     Objective:   Physical Exam Pharynx is injected. Rapid strep screen negative. TMs are full but not infected. Neck supple. Chest clear.       Assessment & Plan:  Acute pharyngitis  Plan: Amoxicillin 500 mg 3 times daily for 10 days.

## 2013-10-29 ENCOUNTER — Other Ambulatory Visit: Payer: Self-pay

## 2013-10-29 ENCOUNTER — Telehealth: Payer: Self-pay | Admitting: Internal Medicine

## 2013-10-29 MED ORDER — HYDROCODONE-ACETAMINOPHEN 5-325 MG PO TABS: 1.0000 | ORAL_TABLET | Freq: Three times a day (TID) | ORAL | Status: DC | PRN

## 2013-10-29 NOTE — Telephone Encounter (Signed)
Refill once 

## 2013-10-31 ENCOUNTER — Other Ambulatory Visit: Payer: Self-pay

## 2013-11-27 ENCOUNTER — Other Ambulatory Visit: Payer: Self-pay | Admitting: Internal Medicine

## 2013-11-28 ENCOUNTER — Telehealth: Payer: Self-pay | Admitting: Internal Medicine

## 2013-11-28 ENCOUNTER — Other Ambulatory Visit: Payer: Self-pay

## 2013-11-28 MED ORDER — HYDROCODONE-ACETAMINOPHEN 5-325 MG PO TABS: 1.0000 | ORAL_TABLET | Freq: Three times a day (TID) | ORAL | Status: DC | PRN

## 2013-11-28 NOTE — Telephone Encounter (Signed)
Please check and see if time of refill is appropriate

## 2013-12-30 ENCOUNTER — Other Ambulatory Visit: Payer: Self-pay

## 2013-12-30 MED ORDER — HYDROCODONE-ACETAMINOPHEN 5-325 MG PO TABS: 1.0000 | ORAL_TABLET | Freq: Three times a day (TID) | ORAL | Status: DC | PRN

## 2014-01-27 ENCOUNTER — Other Ambulatory Visit: Payer: Self-pay | Admitting: Internal Medicine

## 2014-01-28 ENCOUNTER — Other Ambulatory Visit: Payer: Self-pay

## 2014-01-28 MED ORDER — HYDROCODONE-ACETAMINOPHEN 5-325 MG PO TABS: 1.0000 | ORAL_TABLET | Freq: Three times a day (TID) | ORAL | Status: DC | PRN

## 2014-01-28 NOTE — Telephone Encounter (Signed)
Refill once 

## 2014-02-03 ENCOUNTER — Ambulatory Visit (INDEPENDENT_AMBULATORY_CARE_PROVIDER_SITE_OTHER): Payer: BC Managed Care – PPO | Admitting: Internal Medicine

## 2014-02-03 ENCOUNTER — Encounter: Payer: Self-pay | Admitting: Internal Medicine

## 2014-02-03 VITALS — BP 110/80 | HR 92 | Temp 99.5°F | Wt 172.0 lb

## 2014-02-03 DIAGNOSIS — K5732 Diverticulitis of large intestine without perforation or abscess without bleeding: Secondary | ICD-10-CM

## 2014-02-03 DIAGNOSIS — R1032 Left lower quadrant pain: Secondary | ICD-10-CM

## 2014-02-03 LAB — POCT URINALYSIS DIPSTICK
Bilirubin, UA: NEGATIVE
Glucose, UA: NEGATIVE
Ketones, UA: NEGATIVE
Leukocytes, UA: NEGATIVE
Nitrite, UA: NEGATIVE
PROTEIN UA: NEGATIVE
RBC UA: NEGATIVE
Spec Grav, UA: 1.01
UROBILINOGEN UA: NEGATIVE
pH, UA: 6.5

## 2014-02-03 MED ORDER — METRONIDAZOLE 500 MG PO TABS: 500.0000 mg | ORAL_TABLET | Freq: Three times a day (TID) | ORAL | Status: DC

## 2014-02-03 MED ORDER — CIPROFLOXACIN HCL 500 MG PO TABS
500.0000 mg | ORAL_TABLET | Freq: Two times a day (BID) | ORAL | Status: DC
Start: 2014-02-03 — End: 2014-03-18

## 2014-02-03 NOTE — Patient Instructions (Addendum)
CBC has been drawn. Take Cipro 500 mg twice daily for 10 days. Take Flagyl 500 mg 3 times daily for 7 days. Have CT of abdomen and pelvis with contrast tomorrow. Stay on clear liquids.

## 2014-02-03 NOTE — Progress Notes (Signed)
   Subjective:    Patient ID: Tammy Farley, female    DOB: 1960/08/02, 53 y.o.   MRN: [id number]  HPI  Patient came in with another family member who had an appointment and was complaining of abdominal pain. We saw this patient on an urgent basis today. She has a history of diverticulitis. Last episode was a year ago.  Has a several day history of left lower quadrant abdominal pain which has gotten worse. Has had some bright red blood per rectum x 2. She does have a history of hemorrhoids. Had decreased caliber of stool yesterday.    Review of Systems     Objective:   Physical Exam Abdomen is soft. No hepatosplenomegaly or masses. She is tender in the left lower quadrant with rebound tenderness. There is no stool or blood in the rectal vault. Urinalysis is normal.       Assessment & Plan:  Probable diverticulitis  Plan: CBC with differential. Patient is to have CT of the abdomen and pelvis with contrast. She came in late in the afternoon today we cannot get it done today as an outpatient. She will have it tomorrow. We'll: Start on Cipro 500 mg twice daily for 10 days. She'll also take Flagyl 500 mg 3 times a day for 7 days. Clear liquid diet.  This, if confirmed, will be her third episode and she should see gastroenterologist for further evaluation.  25 minutes spent with patient

## 2014-02-04 ENCOUNTER — Telehealth: Payer: Self-pay | Admitting: Internal Medicine

## 2014-02-04 ENCOUNTER — Ambulatory Visit
Admission: RE | Admit: 2014-02-04 | Discharge: 2014-02-04 | Disposition: A | Discharge status: Home / Self Care | Payer: BC Managed Care – PPO | Source: Ambulatory Visit | Attending: Internal Medicine | Admitting: Internal Medicine

## 2014-02-04 DIAGNOSIS — K5732 Diverticulitis of large intestine without perforation or abscess without bleeding: Secondary | ICD-10-CM

## 2014-02-04 LAB — BASIC METABOLIC PANEL
BUN: 12 mg/dL (ref 6–23)
CALCIUM: 9.3 mg/dL (ref 8.4–10.5)
CO2: 25 mEq/L (ref 19–32)
Chloride: 104 mEq/L (ref 96–112)
Creat: 0.83 mg/dL (ref 0.50–1.10)
GLUCOSE: 78 mg/dL (ref 70–99)
Potassium: 3.8 mEq/L (ref 3.5–5.3)
SODIUM: 138 meq/L (ref 135–145)

## 2014-02-04 LAB — CBC WITH DIFFERENTIAL/PLATELET
BASOS PCT: 0 % (ref 0–1)
Basophils Absolute: 0 10*3/uL (ref 0.0–0.1)
Eosinophils Absolute: 0.1 10*3/uL (ref 0.0–0.7)
Eosinophils Relative: 1 % (ref 0–5)
HCT: 38.8 % (ref 36.0–46.0)
HEMOGLOBIN: 13 g/dL (ref 12.0–15.0)
Lymphocytes Relative: 45 % (ref 12–46)
Lymphs Abs: 3.3 10*3/uL (ref 0.7–4.0)
MCH: 29.4 pg (ref 26.0–34.0)
MCHC: 33.5 g/dL (ref 30.0–36.0)
MCV: 87.8 fL (ref 78.0–100.0)
MONOS PCT: 6 % (ref 3–12)
Monocytes Absolute: 0.4 10*3/uL (ref 0.1–1.0)
Neutro Abs: 3.5 10*3/uL (ref 1.7–7.7)
Neutrophils Relative %: 48 % (ref 43–77)
PLATELETS: 241 10*3/uL (ref 150–400)
RBC: 4.42 MIL/uL (ref 3.87–5.11)
RDW: 13.4 % (ref 11.5–15.5)
WBC: 7.3 10*3/uL (ref 4.0–10.5)

## 2014-02-04 MED ORDER — IOHEXOL 300 MG/ML  SOLN
100.0000 mL | Freq: Once | INTRAMUSCULAR | Status: AC | PRN
  Administered 2014-02-04: 100 mL via INTRAVENOUS

## 2014-02-04 NOTE — Telephone Encounter (Signed)
Patient contacted regarding results of CT scan. No left-sided diverticulitis noted. Possible cecal thickening or stool in the cecum. Patient says she has not felt well today. Has had some right-sided tenderness when they were pressing on her during CT scan. She's had mucus in her stool recently. Feels like she's had a fever but has not taken temperature. CBC done yesterday showed normal white blood cell count. Patient does take narcotics for fibromyalgia. Sometimes is constipated and recently has been taking senna S. instead of Amitiza. She thought Amitiza caused diarrhea. Will see if gastroenterologist can evaluate her. She is to stay on clear liquids.

## 2014-02-05 ENCOUNTER — Telehealth: Payer: Self-pay | Admitting: Internal Medicine

## 2014-02-05 NOTE — Telephone Encounter (Signed)
Continue antibiotics. Clear liquids.

## 2014-02-06 ENCOUNTER — Other Ambulatory Visit: Payer: Self-pay | Admitting: Internal Medicine

## 2014-02-06 DIAGNOSIS — K5190 Ulcerative colitis, unspecified, without complications: Secondary | ICD-10-CM

## 2014-02-07 NOTE — Telephone Encounter (Signed)
Patient informed. 

## 2014-02-18 ENCOUNTER — Other Ambulatory Visit: Payer: Self-pay | Admitting: Internal Medicine

## 2014-03-03 ENCOUNTER — Telehealth: Payer: Self-pay | Admitting: Internal Medicine

## 2014-03-03 MED ORDER — HYDROCODONE-ACETAMINOPHEN 5-325 MG PO TABS: 1.0000 | ORAL_TABLET | Freq: Three times a day (TID) | ORAL | Status: DC | PRN

## 2014-03-03 NOTE — Telephone Encounter (Signed)
Patient calls needing a refill Rx on her Norco 5-325.  Takes 1 every 8 hours as needed for pain.  Would like to pick up on Tuesday if possible.  Please call when Rx is ready.  Thanks.

## 2014-03-03 NOTE — Telephone Encounter (Signed)
Patient aware norco rx is ready for pick up.

## 2014-03-03 NOTE — Telephone Encounter (Signed)
Please refill.

## 2014-03-18 ENCOUNTER — Ambulatory Visit (INDEPENDENT_AMBULATORY_CARE_PROVIDER_SITE_OTHER): Payer: BC Managed Care – PPO | Admitting: Internal Medicine

## 2014-03-18 ENCOUNTER — Encounter: Payer: Self-pay | Admitting: Internal Medicine

## 2014-03-18 VITALS — BP 112/70 | HR 92 | Ht 62.0 in | Wt 174.2 lb

## 2014-03-18 DIAGNOSIS — K5732 Diverticulitis of large intestine without perforation or abscess without bleeding: Secondary | ICD-10-CM

## 2014-03-18 DIAGNOSIS — R1032 Left lower quadrant pain: Secondary | ICD-10-CM

## 2014-03-18 MED ORDER — MOVIPREP 100 G PO SOLR: 1.0000 | Freq: Once | ORAL | Status: DC

## 2014-03-18 NOTE — Patient Instructions (Addendum)
You have been scheduled for a colonoscopy. Please follow written instructions given to you at your visit today.  Please pick up your prep kit at the pharmacy within the next 1-3 days. If you use inhalers (even only as needed), please bring them with you on the day of your procedure. Your physician has requested that you go to www.startemmi.com and enter the access code given to you at your visit today. This web site gives a general overview about your procedure. However, you should still follow specific instructions given to you by our office regarding your preparation for the procedure.  Cc:Dr Baxley

## 2014-03-18 NOTE — Progress Notes (Signed)
Tammy Farley Jan 23, 1961 [id number]  Note: This dictation was prepared with Dragon digital system. Any transcriptional errors that result from this procedure are unintentional.   History of Present Illness: This is a 53 year old white female with the episode of lower abdominal pain predominantly in the left lower quadrant which started the about 4 weeks ago and was treated as diverticulitis by Dr. Lenord Farley with Cipro 500 twice a day and Flagyl 500 mg 3 times a day for one week. Her white cell count was normal but she says she was having fever. She had at least 2 episodes of rectal bleeding She had a single episode one year ago in August 2014 which was treated as diverticulitis and  CT scan of the abdomen showed sigmoid thickening and stranding. Prior diverticulitis occurred in February 2012 were on CT scan she had the changes in the sigmoid colon consistent with active inflammation but there was no abscess or perforation. The first attack of diverticulitis occurred about 10 years ago and she was hospitalized for 7 days and treated with antibiotics. Current CT scan of the abdomen from  3 weeks ago showed a post cholecystectomy state and cecal thickening but no evidence of diverticulitis in the sigmoid colon. Inflammatory changes in the right colon was suggestive of either colitis, typhlitis or cancer. These changes were not present on the last CT scan from August 2014. She is about 50% improved. She is on chronic narcotics for fibromyalgia using hydrocodone 3 times a day she is also on Amitiza 24 mcg twice a day having about 3 loose bowel movements daily. She is on Topamax for headaches, Wellbutrin and dietary vitamin supplements.    Past Medical History  Diagnosis Date  . Hyperlipidemia   . Vitamin D deficiency   . Fibromyalgia   . Diverticulitis   . Diverticulosis   . Internal hemorrhoids     Past Surgical History  Procedure Laterality Date  . Cholecystectomy    . Abdominal hysterectomy    .  Cosmetic surgery      tummy tuck    No Known Allergies  Family history and social history have been reviewed.  Review of Systems: Denies nausea or vomiting  The remainder of the 10 point ROS is negative except as outlined in the H&P  Physical Exam: General Appearance Well developed, in no distress mildly overweight Eyes  Non icteric  HEENT  Non traumatic, normocephalic  Mouth No lesion, tongue papillated, no cheilosis Neck Supple without adenopathy, thyroid not enlarged, no carotid bruits, no JVD Lungs Clear to auscultation bilaterally COR Normal S1, normal S2, regular rhythm, no murmur, quiet precordium Abdomen soft tender diffusely and lower abdomen. Status post abdominoplasty. Tenderness along sigmoid colon. No rebound or palpable mass Rectal small amount of Hemoccult negative stool Extremities  No pedal edema Skin No lesions Neurological Alert and oriented x 3 Psychological Normal mood and affect  Assessment and Plan:   53 year old white female with the history of "diverticulitis documented on prior CT scans in 2012, and 2014. Current episode is different in that it raises a question of colitis or right-sided of inflammatory process  or neoplastic process. Ischemic colitis would be a possibility caused by use of narcotics causing  low-flow state. She is Hemoccult negative on today's exam although she describes rectal bleeding.at the onset of the episode. Since the current episode is so much different than the previous one I would like to proceed with colonoscopy to rule out other etiologies of her abdominal pain she is a  very dependent on her narcotics and would not be willing to cut back on it. But the narcotics are definitely associated with higher incidence of ischemic colitis. I have encouraged her to drink lot of  liquids and stay on  high fiber diet. She will add  additional fiber to her vitamin shake in the morning. We will schedule for colonoscopy    Lina SarDora  [name] 03/18/2014

## 2014-03-24 ENCOUNTER — Encounter: Payer: Self-pay | Admitting: Internal Medicine

## 2014-04-07 ENCOUNTER — Telehealth: Payer: Self-pay

## 2014-04-07 MED ORDER — HYDROCODONE-ACETAMINOPHEN 5-325 MG PO TABS: 1.0000 | ORAL_TABLET | Freq: Three times a day (TID) | ORAL | Status: DC | PRN

## 2014-04-07 NOTE — Telephone Encounter (Signed)
Hydrocodone rx printed.  Patient aware it will be ready for pick up 04/08/2014.

## 2014-04-07 NOTE — Telephone Encounter (Signed)
Patient called requesting hydrocodone refill.  She would like her husband to pick it up.  Please advise.

## 2014-04-07 NOTE — Telephone Encounter (Signed)
Refill once 

## 2014-04-16 ENCOUNTER — Telehealth: Payer: Self-pay | Admitting: Internal Medicine

## 2014-04-17 NOTE — Telephone Encounter (Signed)
New instructions created and placed at front desk for patient pick up.

## 2014-04-18 ENCOUNTER — Ambulatory Visit (AMBULATORY_SURGERY_CENTER): Payer: BC Managed Care – PPO | Admitting: Internal Medicine

## 2014-04-18 ENCOUNTER — Encounter: Payer: Self-pay | Admitting: Internal Medicine

## 2014-04-18 VITALS — BP 109/71 | HR 76 | Temp 98.4°F | Resp 18 | Ht 62.0 in | Wt 174.0 lb

## 2014-04-18 DIAGNOSIS — R933 Abnormal findings on diagnostic imaging of other parts of digestive tract: Secondary | ICD-10-CM

## 2014-04-18 DIAGNOSIS — Z8719 Personal history of other diseases of the digestive system: Secondary | ICD-10-CM

## 2014-04-18 DIAGNOSIS — K5732 Diverticulitis of large intestine without perforation or abscess without bleeding: Secondary | ICD-10-CM

## 2014-04-18 MED ORDER — DICYCLOMINE HCL 10 MG PO CAPS: 10.0000 mg | ORAL_CAPSULE | Freq: Three times a day (TID) | ORAL | Status: DC | PRN

## 2014-04-18 MED ORDER — SODIUM CHLORIDE 0.9 % IV SOLN: 500.0000 mL | INTRAVENOUS | Status: DC

## 2014-04-18 NOTE — Progress Notes (Signed)
Report to PACU, RN, vss, BBS= Clear.  

## 2014-04-18 NOTE — Op Note (Signed)
[facility] [address] [place], [id number]   COLONOSCOPY PROCEDURE REPORT  PATIENT: Tammy Farley, Amry  MR#: [id number] BIRTHDATE: 1960-10-11 , 53  yrs. old GENDER: female ENDOSCOPIST: Hart Carwinora M Jacquese Hackman, MD REFERRED WN:UUVOBY:Mary Waymond CeraJohn Baxley, M.D. PROCEDURE DATE:  04/18/2014 PROCEDURE:   Colonoscopy, diagnostic First Screening Colonoscopy - Avg.  risk and is 50 yrs.  old or older - No.  Prior Negative Screening - Now for repeat screening. N/A  History of Adenoma - Now for follow-up colonoscopy & has been > or = to 3 yrs.  N/A  Polyps Removed Today? No. ASA CLASS:   Class I INDICATIONS:acute abdominal pain.and hematochezia.  Abnormal CT scan showing thickening of the cecum and right colon.  History of diverticulosis on colonoscopy in October 2011.  Question of colitis versus diverticulitis. MEDICATIONS: Monitored anesthesia care and Propofol 200 mg IV  DESCRIPTION OF PROCEDURE:   After the risks benefits and alternatives of the procedure were thoroughly explained, informed consent was obtained.  The digital rectal exam revealed no abnormalities of the rectum.   The LB PFC-H190 [id number]  endoscope was introduced through the anus and advanced to the cecum, which was identified by both the appendix and ileocecal valve. No adverse events experienced.   The quality of the prep was good, using MoviPrep  The instrument was then slowly withdrawn as the colon was fully examined.      COLON FINDINGS: There was moderate diverticulosis noted throughout the entire examined colon with associated angulation and tortuosity.  Retroflexed views revealed no abnormalities. The time to cecum=5 minutes 34 seconds.  Withdrawal time=6 minutes 59 seconds.  The scope was withdrawn and the procedure completed. COMPLICATIONS: There were no immediate complications.  ENDOSCOPIC IMPRESSION: 1.There was moderate diverticulosis noted throughout the entire examined colon,predominantly in the  sigmoid colon 2. Exam of the cecum and ascending colon showed normal mucosa, no evidence of colitis all inflammatory changes so present on the CT scan have now resolved. She will advance to high-fiber diet and start fiber supplements  RECOMMENDATIONS: High-fiber diet Benefiber 1 tablespoon daily Use Bentyl 10 mg when necessary: Spasm  eSigned:  Hart Carwinora M Giavonni Cizek, MD 04/18/2014 12:02 PM   cc:   PATIENT NAME:  Tammy Farley, [name] MR#: [id number]

## 2014-04-21 ENCOUNTER — Telehealth: Payer: Self-pay

## 2014-04-21 NOTE — Telephone Encounter (Signed)
  Follow up Call-  Call back number 04/18/2014  Post procedure Call Back phone  # [phone]  Permission to leave phone message Yes     Patient questions:  Do you have a fever, pain , or abdominal swelling? No. Pain Score  0 *  Have you tolerated food without any problems? Yes.    Have you been able to return to your normal activities? Yes.    Do you have any questions about your discharge instructions: Diet   No. Medications  No. Follow up visit  No.  Do you have questions or concerns about your Care? No.  Actions: * If pain score is 4 or above: No action needed, pain <4.

## 2014-04-22 ENCOUNTER — Encounter: Payer: BC Managed Care – PPO | Admitting: Internal Medicine

## 2014-04-30 ENCOUNTER — Encounter: Payer: BC Managed Care – PPO | Admitting: Internal Medicine

## 2014-05-05 ENCOUNTER — Encounter: Payer: Self-pay | Admitting: Internal Medicine

## 2014-05-05 ENCOUNTER — Ambulatory Visit (INDEPENDENT_AMBULATORY_CARE_PROVIDER_SITE_OTHER): Payer: BC Managed Care – PPO | Admitting: Internal Medicine

## 2014-05-05 VITALS — BP 100/78 | HR 89 | Temp 98.8°F | Wt 173.0 lb

## 2014-05-05 DIAGNOSIS — K069 Disorder of gingiva and edentulous alveolar ridge, unspecified: Secondary | ICD-10-CM

## 2014-05-05 MED ORDER — HYDROCODONE-ACETAMINOPHEN 5-325 MG PO TABS: 1.0000 | ORAL_TABLET | Freq: Three times a day (TID) | ORAL | Status: DC | PRN

## 2014-05-05 NOTE — Progress Notes (Signed)
   Subjective:    Patient ID: Tammy Farley, female    DOB: 03-30-1961, 53 y.o.   MRN: [id number]  HPI  Patient says she is going to have to have some extensive dental work in the near future. The first procedure is planned for this coming Wednesday, November 18. Oral surgeon is Dr. Gwendlyn DeutscherWilliam Brown. Patient says that she's been having recurrent dental infections along her mandibular gingival area. She says the mandible is infected and she's given have to have some bone grafting done. She says her dentist implied this could be the cause of some of her sinus infections and headaches. Wanted to see if medical insurance would pay for these procedures. I'm not sure about this. I did try to call Dr. Manson PasseyBrown today but he was out of the office. Had patient speak with office assistant regarding this request. I with think an ENT physician would be in a better position than myself to give an opinion about this. However the patient does not want to wait for an ENT appointment since the first procedures planned on Wednesday, November 18.  Patient says that she had an injury to her mandible when she was a child. She was struck from beneath the mandible and subsequently had to have root canals done. Says the problem is long-standing and she's been known for some time she would need to have it done. Says the entire process will take about 8 months.    Review of Systems     Objective:   Physical Exam Appears to have small abscess on gingiva near lower central incisor on the right       Assessment & Plan:  Recurrent gingival infections  Fibromyalgia syndrome  By patient's history, osteomyelitis of mandible.Currently we do not have records from oral surgeon  Plan: At her request refill hydrocodone for fibromyalgia pain.  25 minutes spent with patient and on the phone with Dr. Theora GianottiBrown's office

## 2014-05-05 NOTE — Patient Instructions (Addendum)
Patient has decided go ahead and proceed with oral surgery per Dr. Manson PasseyBrown Wednesday, November 18. She has decided not to pursue second opinion from ENT physician for medical insurance purposes. Refill hydrocodone/APAP  No. 90 with no refill

## 2014-05-07 ENCOUNTER — Other Ambulatory Visit: Payer: Self-pay | Admitting: Internal Medicine

## 2014-05-08 MED ORDER — DIAZEPAM 5 MG PO TABS: ORAL_TABLET | ORAL | Status: DC

## 2014-05-08 NOTE — Telephone Encounter (Signed)
Refill x 6 months 

## 2014-06-09 ENCOUNTER — Telehealth: Payer: Self-pay | Admitting: Internal Medicine

## 2014-06-09 ENCOUNTER — Other Ambulatory Visit: Payer: Self-pay | Admitting: *Deleted

## 2014-06-09 MED ORDER — HYDROCODONE-ACETAMINOPHEN 5-325 MG PO TABS: 1.0000 | ORAL_TABLET | Freq: Three times a day (TID) | ORAL | Status: DC | PRN

## 2014-06-09 NOTE — Telephone Encounter (Signed)
Script was printed pt notified she can pick up her pain medication script

## 2014-06-09 NOTE — Telephone Encounter (Signed)
Please refill as before 

## 2014-06-09 NOTE — Telephone Encounter (Signed)
Contacted patient and advised Rx ready for pick up.  Instructed closed for lunch 1-2.

## 2014-06-09 NOTE — Telephone Encounter (Signed)
Needs a refill on her Vicodin 5-325 mg.  Please call her when it's ready to be picked up.  Thanks.

## 2014-06-23 ENCOUNTER — Other Ambulatory Visit: Payer: Self-pay | Admitting: Internal Medicine

## 2014-07-14 ENCOUNTER — Telehealth: Payer: Self-pay | Admitting: Internal Medicine

## 2014-07-14 ENCOUNTER — Other Ambulatory Visit: Payer: Self-pay | Admitting: *Deleted

## 2014-07-14 MED ORDER — HYDROCODONE-ACETAMINOPHEN 5-325 MG PO TABS: 1.0000 | ORAL_TABLET | Freq: Three times a day (TID) | ORAL | Status: DC | PRN

## 2014-07-14 NOTE — Telephone Encounter (Signed)
Needs a refill on her Norco/Vicodin 5-325.  She takes 1 every 8 hours as needed for pain.    Please call patient when ready to pick up.  Thanks.

## 2014-07-14 NOTE — Telephone Encounter (Signed)
Script for hydrocodone printed for 30 day suppy as per Dr Lenord FellersBaxley. Message left for patient script ready for pick up

## 2014-07-14 NOTE — Telephone Encounter (Signed)
Please refill once 

## 2014-07-17 ENCOUNTER — Encounter: Payer: Self-pay | Admitting: Internal Medicine

## 2014-07-19 ENCOUNTER — Other Ambulatory Visit: Payer: Self-pay | Admitting: Internal Medicine

## 2014-08-12 ENCOUNTER — Telehealth: Payer: Self-pay | Admitting: Internal Medicine

## 2014-08-12 ENCOUNTER — Other Ambulatory Visit: Payer: Self-pay | Admitting: *Deleted

## 2014-08-12 MED ORDER — HYDROCODONE-ACETAMINOPHEN 5-325 MG PO TABS: 1.0000 | ORAL_TABLET | Freq: Three times a day (TID) | ORAL | Status: DC | PRN

## 2014-08-12 NOTE — Telephone Encounter (Signed)
Script for hydrocodone printed for Dr Baxley to sign 

## 2014-08-12 NOTE — Telephone Encounter (Signed)
Refill once 

## 2014-08-12 NOTE — Telephone Encounter (Signed)
Needs a refill on her Norco 5-325.  She takes 1 every 8 hours as needed for pain.    Advised we would call her when Rx is ready for pick up.

## 2014-08-17 ENCOUNTER — Other Ambulatory Visit: Payer: Self-pay | Admitting: Internal Medicine

## 2014-09-15 ENCOUNTER — Other Ambulatory Visit: Payer: Self-pay | Admitting: *Deleted

## 2014-09-15 ENCOUNTER — Telehealth: Payer: Self-pay | Admitting: Internal Medicine

## 2014-09-15 MED ORDER — HYDROCODONE-ACETAMINOPHEN 5-325 MG PO TABS: 1.0000 | ORAL_TABLET | Freq: Three times a day (TID) | ORAL | Status: DC | PRN

## 2014-09-15 NOTE — Telephone Encounter (Signed)
Refill once 

## 2014-09-15 NOTE — Telephone Encounter (Signed)
Needs to get her Rx refill on her Norco/Vicodin 5-325mg .   Looks like she takes 1 every 8 hours as needed for pain.    Please call the patient when ready for pick up.

## 2014-10-13 ENCOUNTER — Telehealth: Payer: Self-pay | Admitting: Internal Medicine

## 2014-10-13 ENCOUNTER — Other Ambulatory Visit: Payer: Self-pay | Admitting: Internal Medicine

## 2014-10-13 NOTE — Telephone Encounter (Signed)
Cannot refill until 4/28 which is Thursday.

## 2014-10-13 NOTE — Telephone Encounter (Signed)
Needs a refill on her Rx for Norco 5-325mg .  Last filled 09/15/14.    Advised that we would call her when ready to pick up Rx.

## 2014-10-16 ENCOUNTER — Other Ambulatory Visit: Payer: Self-pay | Admitting: *Deleted

## 2014-10-16 MED ORDER — HYDROCODONE-ACETAMINOPHEN 5-325 MG PO TABS: 1.0000 | ORAL_TABLET | Freq: Three times a day (TID) | ORAL | Status: DC | PRN

## 2014-10-16 NOTE — Telephone Encounter (Signed)
Hydrocodone script printed for Dr Baxley to sign 

## 2014-10-16 NOTE — Telephone Encounter (Signed)
Left message for patient on her cell phone advising that Rx is ready for pick up.

## 2014-11-11 ENCOUNTER — Telehealth: Payer: Self-pay | Admitting: Internal Medicine

## 2014-11-11 MED ORDER — HYDROCODONE-ACETAMINOPHEN 5-325 MG PO TABS: 1.0000 | ORAL_TABLET | Freq: Three times a day (TID) | ORAL | Status: DC | PRN

## 2014-11-11 NOTE — Telephone Encounter (Signed)
Patient is calling for refill on her Norco.  Last filled on 4/28.  She is leaving to go out of town tomorrow.  Is it possible for us to date a prescription for 5/28 and her pick it up tomorrow to take with her and she could have it with her on her trip to get filled out of town at a drug store where she's at on the 28th?  Otherwise, she will be without her medicine until she returns on 6/1.    Please advise.

## 2014-11-11 NOTE — Telephone Encounter (Signed)
Refill once as requested. Referring her to Dr. Thyra BreedMark Phillips for pain management.

## 2014-11-11 NOTE — Telephone Encounter (Signed)
Advised patient she can come and pick up Rx.  Providing Dr. Thyra BreedMark Phillips name and information to patient as well.

## 2014-12-08 ENCOUNTER — Telehealth: Payer: Self-pay | Admitting: Internal Medicine

## 2014-12-08 NOTE — Telephone Encounter (Signed)
Calling for refill on Norco 5-325 mg.  She has an appointment with Dr. Gardiner Rhyme in July.  She is leaving to go out of town on Wednesday and is requesting to p/u script on tomorrow.  ??

## 2014-12-08 NOTE — Telephone Encounter (Signed)
LMOM (detailed) that we filled that Rx on 5/24 (4 days early due to her going out of town last month).  Now she's requesting it 4 days early again because she's going out of town this month again which will cause her to be even earlier.  She should still have meds.  Advised patient that we will not fill Rx until 6/24.  Patient instructed to call us back if she has further questions.

## 2014-12-08 NOTE — Telephone Encounter (Signed)
Cannot refill until 6/24

## 2014-12-08 NOTE — Telephone Encounter (Signed)
Patient called back and had not listened to message; advised of the message below.  States that she has been taking the Rx as prescribed and she doesn't "think" that she has extra meds.  Advised she will go home and look to see if she has any extra.  States she will be out of town until Monday, 6/27.  Wants to know if she can pick up script on Thursday, 6/23.  Advised why Dr. Lenord Fellers was holding firm on the 6/24 script.  Patient wanted to know if script could be post-dated for 6/24.  Advised she should go home and count her meds to see why she doesn't have extra at this point.  Then call us back tomorrow and let us know.

## 2014-12-09 ENCOUNTER — Other Ambulatory Visit: Payer: Self-pay | Admitting: *Deleted

## 2014-12-09 MED ORDER — HYDROCODONE-ACETAMINOPHEN 5-325 MG PO TABS: 1.0000 | ORAL_TABLET | Freq: Three times a day (TID) | ORAL | Status: DC | PRN

## 2014-12-09 NOTE — Telephone Encounter (Signed)
Post dated script for Hydrocodone as requested by Dr Lenord Fellers

## 2014-12-09 NOTE — Telephone Encounter (Signed)
Patient called back this a.m. to advise that she will run out of meds on Sunday, 6/26.  Wanted to know if Dr. Lenord Fellers would post-date the script because she is going out of town leaving Wednesday, 6/22 and will not return until Monday, 6/27 at which time she will be out of meds.    I spoke with Dr. Lenord Fellers and she is willing to post date script.  Script printed and noted to NOT be filled BEFORE 12/14/14.  Called patient to advise script is ready to be picked up.  Patient will pick up Rx on her way out of town in the a.m.

## 2014-12-16 ENCOUNTER — Other Ambulatory Visit: Payer: Self-pay | Admitting: Internal Medicine

## 2014-12-16 NOTE — Telephone Encounter (Signed)
Refill once. She should  discuss with pain management physician

## 2014-12-17 NOTE — Telephone Encounter (Signed)
Patient to follow up with pain management for further refills on this medication

## 2014-12-31 ENCOUNTER — Other Ambulatory Visit: Payer: Self-pay | Admitting: Internal Medicine

## 2015-01-01 NOTE — Telephone Encounter (Signed)
Refill once 

## 2015-01-01 NOTE — Telephone Encounter (Signed)
Nurse has pointed out pt has requested refill too soon

## 2015-03-17 ENCOUNTER — Other Ambulatory Visit: Payer: Self-pay | Admitting: Internal Medicine

## 2015-03-17 NOTE — Telephone Encounter (Signed)
Defer to pain management physician. Ask pt to contact them

## 2015-03-26 ENCOUNTER — Other Ambulatory Visit: Payer: BLUE CROSS/BLUE SHIELD | Admitting: Internal Medicine

## 2015-03-26 DIAGNOSIS — Z1329 Encounter for screening for other suspected endocrine disorder: Secondary | ICD-10-CM

## 2015-03-26 DIAGNOSIS — Z1321 Encounter for screening for nutritional disorder: Secondary | ICD-10-CM

## 2015-03-26 DIAGNOSIS — Z0000 Encounter for general adult medical examination without abnormal findings: Secondary | ICD-10-CM

## 2015-03-26 DIAGNOSIS — Z130 Encounter for screening for diseases of the blood and blood-forming organs and certain disorders involving the immune mechanism: Secondary | ICD-10-CM

## 2015-03-26 DIAGNOSIS — Z13220 Encounter for screening for lipoid disorders: Secondary | ICD-10-CM

## 2015-03-26 LAB — LIPID PANEL
Cholesterol: 158 mg/dL (ref 125–200)
HDL: 49 mg/dL (ref >=46)
LDL CALC: 84 mg/dL (ref <130)
Total CHOL/HDL Ratio: 3.2 Ratio (ref <=5.0)
Triglycerides: 125 mg/dL (ref <150)
VLDL: 25 mg/dL (ref <30)

## 2015-03-26 LAB — CBC WITH DIFFERENTIAL/PLATELET
BASOS ABS: 0 10*3/uL (ref 0.0–0.1)
BASOS PCT: 0 % (ref 0–1)
EOS PCT: 1 % (ref 0–5)
Eosinophils Absolute: 0.1 10*3/uL (ref 0.0–0.7)
HEMATOCRIT: 39.3 % (ref 36.0–46.0)
HEMOGLOBIN: 13.7 g/dL (ref 12.0–15.0)
LYMPHS PCT: 36 % (ref 12–46)
Lymphs Abs: 2 10*3/uL (ref 0.7–4.0)
MCH: 30.3 pg (ref 26.0–34.0)
MCHC: 34.9 g/dL (ref 30.0–36.0)
MCV: 86.9 fL (ref 78.0–100.0)
MPV: 9.9 fL (ref 8.6–12.4)
Monocytes Absolute: 0.4 10*3/uL (ref 0.1–1.0)
Monocytes Relative: 7 % (ref 3–12)
NEUTROS ABS: 3.1 10*3/uL (ref 1.7–7.7)
Neutrophils Relative %: 56 % (ref 43–77)
Platelets: 230 10*3/uL (ref 150–400)
RBC: 4.52 MIL/uL (ref 3.87–5.11)
RDW: 13.9 % (ref 11.5–15.5)
WBC: 5.5 10*3/uL (ref 4.0–10.5)

## 2015-03-26 LAB — COMPLETE METABOLIC PANEL WITH GFR
ALT: 19 U/L (ref 6–29)
AST: 16 U/L (ref 10–35)
Albumin: 4.4 g/dL (ref 3.6–5.1)
Alkaline Phosphatase: 57 U/L (ref 33–130)
BUN: 10 mg/dL (ref 7–25)
CALCIUM: 9.2 mg/dL (ref 8.6–10.4)
CHLORIDE: 105 mmol/L (ref 98–110)
CO2: 27 mmol/L (ref 20–31)
CREATININE: 0.64 mg/dL (ref 0.50–1.05)
GFR, Est African American: >89 mL/min (ref >=60)
GFR, Est Non African American: >89 mL/min (ref >=60)
GLUCOSE: 79 mg/dL (ref 65–99)
POTASSIUM: 4.5 mmol/L (ref 3.5–5.3)
SODIUM: 139 mmol/L (ref 135–146)
Total Bilirubin: 0.4 mg/dL (ref 0.2–1.2)
Total Protein: 6.8 g/dL (ref 6.1–8.1)

## 2015-03-26 LAB — TSH: TSH: 1.757 u[IU]/mL (ref 0.350–4.500)

## 2015-03-27 LAB — VITAMIN D 25 HYDROXY (VIT D DEFICIENCY, FRACTURES): VIT D 25 HYDROXY: 36 ng/mL (ref 30–100)

## 2015-03-30 ENCOUNTER — Encounter: Payer: Self-pay | Admitting: Internal Medicine

## 2015-03-30 ENCOUNTER — Ambulatory Visit (INDEPENDENT_AMBULATORY_CARE_PROVIDER_SITE_OTHER): Payer: BLUE CROSS/BLUE SHIELD | Admitting: Internal Medicine

## 2015-03-30 VITALS — BP 122/78 | HR 88 | Temp 98.3°F | Ht 62.0 in | Wt 173.0 lb

## 2015-03-30 DIAGNOSIS — K5792 Diverticulitis of intestine, part unspecified, without perforation or abscess without bleeding: Secondary | ICD-10-CM | POA: Diagnosis not present

## 2015-03-30 DIAGNOSIS — M549 Dorsalgia, unspecified: Secondary | ICD-10-CM

## 2015-03-30 DIAGNOSIS — E785 Hyperlipidemia, unspecified: Secondary | ICD-10-CM

## 2015-03-30 DIAGNOSIS — M5432 Sciatica, left side: Secondary | ICD-10-CM | POA: Diagnosis not present

## 2015-03-30 DIAGNOSIS — G8929 Other chronic pain: Secondary | ICD-10-CM

## 2015-03-30 DIAGNOSIS — Z0000 Encounter for general adult medical examination without abnormal findings: Secondary | ICD-10-CM | POA: Diagnosis not present

## 2015-03-30 DIAGNOSIS — Z8669 Personal history of other diseases of the nervous system and sense organs: Secondary | ICD-10-CM

## 2015-03-30 DIAGNOSIS — M797 Fibromyalgia: Secondary | ICD-10-CM | POA: Diagnosis not present

## 2015-03-30 DIAGNOSIS — K5900 Constipation, unspecified: Secondary | ICD-10-CM | POA: Diagnosis not present

## 2015-03-30 LAB — POCT URINALYSIS DIPSTICK
Bilirubin, UA: NEGATIVE
Glucose, UA: NEGATIVE
Ketones, UA: NEGATIVE
LEUKOCYTES UA: NEGATIVE
NITRITE UA: NEGATIVE
PH UA: 6.5
Protein, UA: NEGATIVE
RBC UA: NEGATIVE
Spec Grav, UA: <=1.005
UROBILINOGEN UA: NEGATIVE

## 2015-03-30 MED ORDER — CIPROFLOXACIN HCL 500 MG PO TABS: 500.0000 mg | ORAL_TABLET | Freq: Two times a day (BID) | ORAL | Status: DC

## 2015-03-30 MED ORDER — METRONIDAZOLE 500 MG PO TABS: 500.0000 mg | ORAL_TABLET | Freq: Three times a day (TID) | ORAL | Status: DC

## 2015-03-30 NOTE — Progress Notes (Signed)
Subjective:    Patient ID: Tammy Farley, female    DOB: [birth year], 54 y.o.   MRN: [id number]  HPI 54 year old Female for health maintenance and evaluation of medical issues. Patient has been seen by Pain management physician. Nerve block has been recommended for back pain and left-sided sciatica. She had a test injection which did help some. She has some more questions for Pain management physician about this. Pain management physician stopped Amatiza and put her on medication for opiod induced constipation. Patient's insurance would not pay for this. She got constipated once again and is having some acute left lower quadrant pain for the past week or 2. Has had low-grade fever. History of diverticulitis. Started on Amitiza again last week.  She has a history of hyperlipidemia, anxiety depression, fibromyalgia syndrome, recurrent diverticulitis, vitamin D deficiency, impingement right shoulder, chronic low back pain and left sciatica, allergic rhinitis, migraine headaches. Prior to seeing pain management physician she was on chronic narcotic medication.  No known drug allergies  Social history: This is her second marriage. Husband is employed in an [employer] which he owns and operates. She travels with him quite a bit. She has a son and 2 daughters from her first marriage. Does not smoke or consume alcohol. Completed high school. Formerly was in [employer]  She has tried Lyrica, Cymbalta and, Neurontin, amitriptyline in the past for headache control and says none of these have worked. She has most recently been treated with Topamax.  History of recurrent urinary infections. Colonoscopy by Dr. Juanda Chance 2011 and  2015.  Hospitalized with diverticulitis around 2004 or 2005 in [facility]. Hysterectomy in 2000. Says she has one ovary remaining. No known drug allergies. Acute diverticulitis in 2012. Acute diverticulitis August 2015.  Tetanus immunization  2011      Review of Systems  Constitutional: Negative.   HENT: Negative.   Respiratory: Negative.   Cardiovascular: Negative.   Genitourinary: Negative.        Objective:   Physical Exam  Constitutional: She appears well-developed and well-nourished. No distress.  HENT:  Head: Normocephalic and atraumatic.  Right Ear: External ear normal.  Left Ear: External ear normal.  Mouth/Throat: Oropharynx is clear and moist.  Eyes: Conjunctivae and EOM are normal. Pupils are equal, round, and reactive to light. Right eye exhibits no discharge. Left eye exhibits no discharge. No scleral icterus.  Neck: Normal range of motion. Neck supple. No JVD present. No thyromegaly present.  Cardiovascular: Normal rate, regular rhythm, normal heart sounds and intact distal pulses.   No murmur heard. Pulmonary/Chest: Effort normal and breath sounds normal. No respiratory distress. She has no wheezes. She has no rales. She exhibits no tenderness.  Breasts normal female without masses  Abdominal: Soft. Bowel sounds are normal. She exhibits no mass. There is tenderness. There is rebound and guarding.  LLQ tenderness  Genitourinary:  Pap deferred status post hysterectomy. Bimanual normal.  Musculoskeletal: She exhibits no edema.  Lymphadenopathy:    She has no cervical adenopathy.  Neurological: She is alert. She has normal reflexes. She displays normal reflexes. No cranial nerve deficit. Coordination normal.  Skin: Skin is warm and dry. No rash noted. She is not diaphoretic.  Psychiatric: She has a normal mood and affect. Her behavior is normal. Judgment and thought content normal.  Vitals reviewed.         Assessment & Plan:  Normal health maintenance exam  Hyperlipidemia-lipid panel liver functions are normal  Acute diverticulitis  treat with Cipro 500 mg twice daily for 10 days and Flagyl 500 mg 3 times daily for 7 days  History of migraine headaches  History of fibromyalgia  Chronic  left sciatica and back pain  Plan: Return in 6 months or as needed. Take Amatiza. Continue to see pain management physician. Annual mammogram.

## 2015-03-30 NOTE — Patient Instructions (Addendum)
Take antibiotics as prescribed for diverticulitis. Resume Amitiza. Return in one year or as needed. It was a pleasure to see you today.

## 2015-04-07 ENCOUNTER — Other Ambulatory Visit: Payer: Self-pay | Admitting: Internal Medicine

## 2015-04-21 ENCOUNTER — Ambulatory Visit: Payer: BLUE CROSS/BLUE SHIELD | Admitting: Internal Medicine

## 2015-04-23 ENCOUNTER — Ambulatory Visit (INDEPENDENT_AMBULATORY_CARE_PROVIDER_SITE_OTHER): Payer: BLUE CROSS/BLUE SHIELD | Admitting: Internal Medicine

## 2015-04-23 ENCOUNTER — Encounter: Payer: Self-pay | Admitting: Internal Medicine

## 2015-04-23 VITALS — BP 106/78 | HR 80 | Temp 98.9°F | Resp 18 | Ht 62.0 in | Wt 175.0 lb

## 2015-04-23 DIAGNOSIS — K5732 Diverticulitis of large intestine without perforation or abscess without bleeding: Secondary | ICD-10-CM

## 2015-04-23 DIAGNOSIS — J029 Acute pharyngitis, unspecified: Secondary | ICD-10-CM

## 2015-04-23 DIAGNOSIS — Z8719 Personal history of other diseases of the digestive system: Secondary | ICD-10-CM

## 2015-04-23 MED ORDER — AZITHROMYCIN 250 MG PO TABS
ORAL_TABLET | ORAL | Status: DC
Start: 2015-04-23 — End: 2015-08-27

## 2015-04-23 NOTE — Patient Instructions (Addendum)
Diverticulitis improved. Take Zithromax Z pak as directed for pharyngitis. Continue Amitiza and try to avoid constipation.

## 2015-04-23 NOTE — Progress Notes (Signed)
   Subjective:    Patient ID: Tammy Farley, female    DOB: [birth year], 54 y.o.   MRN: [id number]  HPI  She is here today to follow-up on recent bout of acute diverticulitis not evaluated by CT scan. Has had previous bout confirmed by CT scan in 2014.  Had another bout in 2015 showing significant sigmoid diverticulosis but not clearcut diverticulitis. History of constipation treated with Amitza. She responded well to Cipro and Flagyl is feeling better. Bouts seemed to be aggravated by constipation.    Also,has also developed sore throat recently. No fever or chills.  Review of Systems     Objective:   Physical Exam  Abdomen is soft obese nondistended without hepatosplenomegaly masses or significant tenderness.  HEENT exam: TMs  are clear and pharynx  Is slightly injected without exudate. Neck is supple without adenopathy. Chest clear to auscultation.       Assessment & Plan:   Acute diverticulitis-resolved  History of constipation  Pharyngitis   Plan: Zithromax Z-PAK take 2 tablets a one followed by 1 tablet days 2 through 5. Continue Amitiza and try to avoid constipation.

## 2015-05-04 ENCOUNTER — Other Ambulatory Visit: Payer: Self-pay | Admitting: Internal Medicine

## 2015-05-20 ENCOUNTER — Encounter: Payer: Self-pay | Admitting: Internal Medicine

## 2015-08-02 ENCOUNTER — Other Ambulatory Visit: Payer: Self-pay | Admitting: Internal Medicine

## 2015-08-27 ENCOUNTER — Ambulatory Visit (INDEPENDENT_AMBULATORY_CARE_PROVIDER_SITE_OTHER): Payer: BLUE CROSS/BLUE SHIELD | Admitting: Internal Medicine

## 2015-08-27 ENCOUNTER — Encounter: Payer: Self-pay | Admitting: Internal Medicine

## 2015-08-27 VITALS — BP 138/88 | HR 106 | Temp 99.5°F | Resp 22 | Ht 62.0 in | Wt 174.0 lb

## 2015-08-27 DIAGNOSIS — R509 Fever, unspecified: Secondary | ICD-10-CM | POA: Diagnosis not present

## 2015-08-27 DIAGNOSIS — R8290 Unspecified abnormal findings in urine: Secondary | ICD-10-CM

## 2015-08-27 DIAGNOSIS — J029 Acute pharyngitis, unspecified: Secondary | ICD-10-CM | POA: Diagnosis not present

## 2015-08-27 DIAGNOSIS — R0789 Other chest pain: Secondary | ICD-10-CM

## 2015-08-27 LAB — POCT URINALYSIS DIPSTICK
Bilirubin, UA: NEGATIVE
Glucose, UA: NEGATIVE
KETONES UA: NEGATIVE
Nitrite, UA: POSITIVE
Protein, UA: NEGATIVE
SPEC GRAV UA: 1.015
UROBILINOGEN UA: 0.2
pH, UA: 7

## 2015-08-27 LAB — POCT RAPID STREP A (OFFICE): RAPID STREP A SCREEN: NEGATIVE

## 2015-08-27 MED ORDER — AMOXICILLIN 500 MG PO CAPS: 500.0000 mg | ORAL_CAPSULE | Freq: Three times a day (TID) | ORAL | Status: DC

## 2015-08-27 MED ORDER — HYDROCODONE-HOMATROPINE 5-1.5 MG/5ML PO SYRP: 5.0000 mL | ORAL_SOLUTION | Freq: Three times a day (TID) | ORAL | Status: DC | PRN

## 2015-08-27 NOTE — Addendum Note (Signed)
Addended by: Dierdre ForthHURCH, Lawanna Cecere L on: 08/27/2015 02:15 PM   Modules accepted: Orders

## 2015-08-27 NOTE — Progress Notes (Signed)
   Subjective:    Patient ID: Tammy Farley, female    DOB: 1961/03/27, 55 y.o.   MRN: [id number]  HPI Patient complaining of severe sore throat and left rib cage pain. Complains of fever but no shaking chills. Hurts to take deep breath because of left rib cage pain. May of caught this from grandchild.    Review of Systems no UTI symptoms but urine specimen is abnormal     Objective:   Physical Exam Pharynx is red. Rapid strep screen negative. TMs are slightly dull bilaterally but not real red. Neck supple without adenopathy. Chest clear to auscultation. No friction rub over left lateral rib but point tenderness in the mid axillary line left rib area       Assessment & Plan:  Acute pharyngitis  Abnormal urinalysis-culture pending-probable UTI  Left rib cage pain  Plan: Hycodan 1 teaspoon by mouth every 8 hours when necessary cough. Amoxicillin 500 mg 3 times daily for 10 days. Await urine culture results.

## 2015-08-27 NOTE — Patient Instructions (Signed)
Amoxicillin 500 mg 3 times daily for 10 days. Urine culture pending. Take Hycodan every 8 hours as needed for cough and pain. Rapid strep screen is negative

## 2015-08-30 ENCOUNTER — Other Ambulatory Visit: Payer: Self-pay | Admitting: Internal Medicine

## 2015-08-30 LAB — CULTURE, URINE COMPREHENSIVE: Colony Count: >=100000

## 2015-09-08 ENCOUNTER — Encounter: Payer: Self-pay | Admitting: Internal Medicine

## 2015-09-08 ENCOUNTER — Ambulatory Visit (INDEPENDENT_AMBULATORY_CARE_PROVIDER_SITE_OTHER): Payer: BLUE CROSS/BLUE SHIELD | Admitting: Internal Medicine

## 2015-09-08 ENCOUNTER — Ambulatory Visit
Admission: RE | Admit: 2015-09-08 | Discharge: 2015-09-08 | Disposition: A | Discharge status: Home / Self Care | Payer: BLUE CROSS/BLUE SHIELD | Source: Ambulatory Visit | Attending: Internal Medicine | Admitting: Internal Medicine

## 2015-09-08 VITALS — BP 118/80 | HR 98 | Temp 99.0°F | Resp 20 | Ht 62.0 in | Wt 173.5 lb

## 2015-09-08 DIAGNOSIS — J209 Acute bronchitis, unspecified: Secondary | ICD-10-CM | POA: Diagnosis not present

## 2015-09-08 DIAGNOSIS — R509 Fever, unspecified: Secondary | ICD-10-CM | POA: Diagnosis not present

## 2015-09-08 DIAGNOSIS — R05 Cough: Secondary | ICD-10-CM

## 2015-09-08 DIAGNOSIS — R059 Cough, unspecified: Secondary | ICD-10-CM

## 2015-09-08 DIAGNOSIS — N390 Urinary tract infection, site not specified: Secondary | ICD-10-CM | POA: Diagnosis not present

## 2015-09-08 LAB — CBC WITH DIFFERENTIAL/PLATELET
Basophils Absolute: 0.1 10*3/uL (ref 0.0–0.1)
Basophils Relative: 1 % (ref 0–1)
EOS PCT: 5 % (ref 0–5)
Eosinophils Absolute: 0.3 10*3/uL (ref 0.0–0.7)
HEMATOCRIT: 39.5 % (ref 36.0–46.0)
Hemoglobin: 13.1 g/dL (ref 12.0–15.0)
LYMPHS PCT: 35 % (ref 12–46)
Lymphs Abs: 2.1 10*3/uL (ref 0.7–4.0)
MCH: 28.9 pg (ref 26.0–34.0)
MCHC: 33.2 g/dL (ref 30.0–36.0)
MCV: 87.2 fL (ref 78.0–100.0)
MONO ABS: 0.4 10*3/uL (ref 0.1–1.0)
MONOS PCT: 7 % (ref 3–12)
MPV: 9.3 fL (ref 8.6–12.4)
Neutro Abs: 3.2 10*3/uL (ref 1.7–7.7)
Neutrophils Relative %: 52 % (ref 43–77)
Platelets: 296 10*3/uL (ref 150–400)
RBC: 4.53 MIL/uL (ref 3.87–5.11)
RDW: 13.5 % (ref 11.5–15.5)
WBC: 6.1 10*3/uL (ref 4.0–10.5)

## 2015-09-08 LAB — POCT URINALYSIS DIPSTICK
Bilirubin, UA: NEGATIVE
GLUCOSE UA: NEGATIVE
KETONES UA: NEGATIVE
Leukocytes, UA: NEGATIVE
Nitrite, UA: NEGATIVE
PROTEIN UA: NEGATIVE
RBC UA: NEGATIVE
SPEC GRAV UA: 1.01
UROBILINOGEN UA: 0.2
pH, UA: 6.5

## 2015-09-08 MED ORDER — ALBUTEROL SULFATE HFA 108 (90 BASE) MCG/ACT IN AERS: 2.0000 | INHALATION_SPRAY | Freq: Four times a day (QID) | RESPIRATORY_TRACT | Status: DC | PRN

## 2015-09-08 MED ORDER — LEVOFLOXACIN 500 MG PO TABS: 500.0000 mg | ORAL_TABLET | Freq: Every day | ORAL | Status: DC

## 2015-09-08 MED ORDER — HYDROCODONE-HOMATROPINE 5-1.5 MG/5ML PO SYRP: 5.0000 mL | ORAL_SOLUTION | Freq: Three times a day (TID) | ORAL | Status: DC | PRN

## 2015-09-08 NOTE — Progress Notes (Signed)
   Subjective:    Patient ID: Tammy Farley, female    DOB: 02-14-1961, 55 y.o.   MRN: [id number]  HPI 55 year old Female Was here March 9 with respiratory infection and urinary tract infection. She was treated with amoxicillin for 10 days. Urine culture grew greater than 100,000 colonies per milliliter E.coli sensitive to amoxicillin. Urine dipstick today shows no evidence of infection. Patient complaining of fever, sore throat and persistent cough. She is out of Hycodan that was prescribed on March 9. She had chest wall pain at the time is well. Complaining of postnasal drip that is discolored. Persistent cough. Says she's having night sweats. Complaining of shortness of breath. Does not have inhaler.    Review of Systems     Objective:   Physical Exam  Skin warm and dry. Nodes none. TMs are clear. Pharynx is red without exudate. Neck is supple. Chest some coarse bronchial breath sounds both lobes bilaterally. No wheezing.      Assessment & Plan:  Acute bronchitis-rule out pneumonia  Recent UTI-resolved after treatment with amoxicillin  Chest wall pain-improved  Plan: Chest x-ray. Levaquin 500 milligrams daily for 10 days. Hycodan 1 teaspoon by mouth every 8 hours when necessary cough. Ventolin inhaler 2 sprays by mouth 4 times daily.

## 2015-09-08 NOTE — Patient Instructions (Signed)
Have chest x-ray. Take Levaquin 500 milligrams daily for 10 days. Hycodan refilled. Ventolin inhaler 2 sprays by mouth 4 times daily. Rest and drink plenty of fluids.

## 2015-09-17 ENCOUNTER — Encounter: Payer: Self-pay | Admitting: Internal Medicine

## 2015-09-22 DIAGNOSIS — M50222 Other cervical disc displacement at C5-C6 level: Secondary | ICD-10-CM | POA: Diagnosis not present

## 2015-09-22 DIAGNOSIS — M791 Myalgia: Secondary | ICD-10-CM | POA: Diagnosis not present

## 2015-09-22 DIAGNOSIS — M50322 Other cervical disc degeneration at C5-C6 level: Secondary | ICD-10-CM | POA: Diagnosis not present

## 2015-09-22 DIAGNOSIS — M461 Sacroiliitis, not elsewhere classified: Secondary | ICD-10-CM | POA: Diagnosis not present

## 2015-09-26 ENCOUNTER — Other Ambulatory Visit: Payer: Self-pay | Admitting: Internal Medicine

## 2015-09-29 ENCOUNTER — Ambulatory Visit (INDEPENDENT_AMBULATORY_CARE_PROVIDER_SITE_OTHER): Payer: BLUE CROSS/BLUE SHIELD | Admitting: Internal Medicine

## 2015-09-29 ENCOUNTER — Encounter: Payer: Self-pay | Admitting: Internal Medicine

## 2015-09-29 ENCOUNTER — Telehealth: Payer: Self-pay | Admitting: Internal Medicine

## 2015-09-29 ENCOUNTER — Ambulatory Visit (INDEPENDENT_AMBULATORY_CARE_PROVIDER_SITE_OTHER): Payer: BLUE CROSS/BLUE SHIELD

## 2015-09-29 VITALS — BP 124/70 | HR 91 | Temp 98.7°F | Resp 18 | Wt 174.0 lb

## 2015-09-29 DIAGNOSIS — R509 Fever, unspecified: Secondary | ICD-10-CM

## 2015-09-29 DIAGNOSIS — M47812 Spondylosis without myelopathy or radiculopathy, cervical region: Secondary | ICD-10-CM | POA: Diagnosis not present

## 2015-09-29 DIAGNOSIS — Z8719 Personal history of other diseases of the digestive system: Secondary | ICD-10-CM | POA: Diagnosis not present

## 2015-09-29 DIAGNOSIS — M797 Fibromyalgia: Secondary | ICD-10-CM | POA: Diagnosis not present

## 2015-09-29 DIAGNOSIS — R1030 Lower abdominal pain, unspecified: Secondary | ICD-10-CM

## 2015-09-29 DIAGNOSIS — K625 Hemorrhage of anus and rectum: Secondary | ICD-10-CM

## 2015-09-29 DIAGNOSIS — R109 Unspecified abdominal pain: Secondary | ICD-10-CM | POA: Diagnosis not present

## 2015-09-29 DIAGNOSIS — G894 Chronic pain syndrome: Secondary | ICD-10-CM | POA: Diagnosis not present

## 2015-09-29 DIAGNOSIS — M47817 Spondylosis without myelopathy or radiculopathy, lumbosacral region: Secondary | ICD-10-CM | POA: Diagnosis not present

## 2015-09-29 LAB — CBC WITH DIFFERENTIAL/PLATELET
Basophils Absolute: 0 cells/uL (ref 0–200)
Basophils Relative: 0 %
EOS PCT: 7 %
Eosinophils Absolute: 476 cells/uL (ref 15–500)
HCT: 40.4 % (ref 35.0–45.0)
HEMOGLOBIN: 13.7 g/dL (ref 11.7–15.5)
LYMPHS ABS: 2720 {cells}/uL (ref 850–3900)
Lymphocytes Relative: 40 %
MCH: 29.3 pg (ref 27.0–33.0)
MCHC: 33.9 g/dL (ref 32.0–36.0)
MCV: 86.3 fL (ref 80.0–100.0)
MPV: 9.5 fL (ref 7.5–12.5)
Monocytes Absolute: 272 cells/uL (ref 200–950)
Monocytes Relative: 4 %
NEUTROS ABS: 3332 {cells}/uL (ref 1500–7800)
NEUTROS PCT: 49 %
Platelets: 230 10*3/uL (ref 140–400)
RBC: 4.68 MIL/uL (ref 3.80–5.10)
RDW: 13.7 % (ref 11.0–15.0)
WBC: 6.8 10*3/uL (ref 3.8–10.8)

## 2015-09-29 LAB — HEMOCCULT GUIAC POC 1CARD (OFFICE): FECAL OCCULT BLD: POSITIVE — AB

## 2015-09-29 MED ORDER — IOPAMIDOL (ISOVUE-300) INJECTION 61%
100.0000 mL | Freq: Once | INTRAVENOUS | Status: AC | PRN
  Administered 2015-09-29: 100 mL via INTRAVENOUS

## 2015-09-29 MED ORDER — FLUTICASONE PROPIONATE 50 MCG/ACT NA SUSP: 2.0000 | Freq: Every day | NASAL | Status: AC

## 2015-09-29 MED ORDER — HYDROCORTISONE ACETATE 25 MG RE SUPP: 25.0000 mg | Freq: Two times a day (BID) | RECTAL | Status: DC

## 2015-09-29 NOTE — Addendum Note (Signed)
Addended by: Dierdre ForthHURCH, Cheyenne Schumm L on: 09/29/2015 12:56 PM   Modules accepted: Orders

## 2015-09-29 NOTE — Patient Instructions (Addendum)
No evidence of acute diverticulitis on CT. CBC with differential is normal. Hemoglobin is normal. Use Anusol rectal suppositories twice daily for 5 days. Refer back to GI for further evaluation of rectal bleeding and recurrent diverticulitis.

## 2015-09-29 NOTE — Telephone Encounter (Signed)
[facility]  Dr. Lenord FellersBaxley peer to peer obtained.  Auth #[id number] effective through 10/28/15.  To be done at [facility] today, 09/29/15 @ 2pm.   Patient given instructions, location, address, phone # and authorization #.  Patient knows to arrive there immediately after auth obtained to begin drinking contrast prior to testing to be done at 2pm.

## 2015-09-29 NOTE — Progress Notes (Addendum)
   Subjective:    Patient ID: Tammy Farley, female    DOB: 19-Sep-1960, 55 y.o.   MRN: [id number]  HPI 55 year old Female with history of recurrent diverticulitis in today with history of rectal bleeding onset on Friday evening after eating out. She had abdominal pain at the same time. She remained on clear liquids for a couple of days but has resumed eating normal diet. Says she has developed low-grade fever. Did not call or seek medical attention until today. Says there was a lot of blood on Friday evening. Says she had rectal bleeding this morning as well. Last bout of diverticulitis was October 2016 when she was here for physical exam. She was treated with Cipro and Flagyl. CT was not obtained at that time. History of constipation likely due to chronic narcotic use for fibromyalgia treated with Amatiza. Patient complaining of decreased stool caliber.  In October 2015 she had colonoscopy by Dr. Juanda ChanceBrodie due to abdominal pain and hematochezia. There was a question of colitis but colitis was not found on colonoscopy. She was found to have diverticulosis.  She has had previous CTs of abdomen and pelvis in 2012, 2014, and 2015. See results in Epic.    Review of Systems     Objective:   Physical Exam  Abdomen is soft nondistended without hepatosplenomegaly or masses. She has lower abdominal pain worse on the left than the right. Anoscopy shows bleeding internal hemorrhoids. Stool is guaiac positive.      Assessment & Plan:  Probable recurrent diverticulitis  Bleeding internal hemorrhoids  Plan: Patient is to have CT of abdomen and pelvis for further evaluation of abdominal pain. CBC with differential drawn and pending. Approval for CT of abdomen and pelvis at [facility] at 2 PM. Further instructions to follow.  Addendum: CT of abdomen and pelvis does not show acute diverticulitis. Was found to have bleeding swollen internal hemorrhoids. Have prescribed  Anusol HC rectal suppositories twice a day for 5 days. Patient might need to see gastroenterologist once again for reevaluation of recurrent diverticulitis. She could have a diverticulum that is bleeding as well.

## 2015-09-29 NOTE — Addendum Note (Signed)
Addended by: Margaree MackintoshBAXLEY, Anajulia Leyendecker J on: 09/29/2015 06:44 PM   Modules accepted: Orders

## 2015-09-30 ENCOUNTER — Telehealth: Payer: Self-pay

## 2015-09-30 DIAGNOSIS — K625 Hemorrhage of anus and rectum: Secondary | ICD-10-CM

## 2015-09-30 NOTE — Telephone Encounter (Signed)
Referral entered for GI consult

## 2015-10-01 ENCOUNTER — Telehealth: Payer: Self-pay

## 2015-10-01 NOTE — Telephone Encounter (Signed)
Patient states that her insurance will not cover the suppositories that were called in for her. They tried to run it with a discount card and it was still going to be over $130.00. Patient is wanting to know if there is something else that can be called in. Please advise.

## 2015-10-01 NOTE — Telephone Encounter (Signed)
Please call druggist and see what is cheaper

## 2015-10-01 NOTE — Telephone Encounter (Signed)
Changed to cream 2.5% 30g as her plan will have all suppositories at a high co-pay. He states that the cream is the cheapest way to go.

## 2015-10-01 NOTE — Telephone Encounter (Signed)
Patient notified

## 2015-10-05 DIAGNOSIS — M50222 Other cervical disc displacement at C5-C6 level: Secondary | ICD-10-CM | POA: Diagnosis not present

## 2015-10-05 DIAGNOSIS — M461 Sacroiliitis, not elsewhere classified: Secondary | ICD-10-CM | POA: Diagnosis not present

## 2015-10-05 DIAGNOSIS — M50322 Other cervical disc degeneration at C5-C6 level: Secondary | ICD-10-CM | POA: Diagnosis not present

## 2015-10-05 DIAGNOSIS — M791 Myalgia: Secondary | ICD-10-CM | POA: Diagnosis not present

## 2015-10-05 DIAGNOSIS — M542 Cervicalgia: Secondary | ICD-10-CM | POA: Diagnosis not present

## 2015-10-07 DIAGNOSIS — M461 Sacroiliitis, not elsewhere classified: Secondary | ICD-10-CM | POA: Diagnosis not present

## 2015-10-07 DIAGNOSIS — M791 Myalgia: Secondary | ICD-10-CM | POA: Diagnosis not present

## 2015-10-07 DIAGNOSIS — M50222 Other cervical disc displacement at C5-C6 level: Secondary | ICD-10-CM | POA: Diagnosis not present

## 2015-10-07 DIAGNOSIS — M50322 Other cervical disc degeneration at C5-C6 level: Secondary | ICD-10-CM | POA: Diagnosis not present

## 2015-10-13 DIAGNOSIS — M461 Sacroiliitis, not elsewhere classified: Secondary | ICD-10-CM | POA: Diagnosis not present

## 2015-10-13 DIAGNOSIS — M542 Cervicalgia: Secondary | ICD-10-CM | POA: Diagnosis not present

## 2015-10-13 DIAGNOSIS — M50222 Other cervical disc displacement at C5-C6 level: Secondary | ICD-10-CM | POA: Diagnosis not present

## 2015-10-13 DIAGNOSIS — M791 Myalgia: Secondary | ICD-10-CM | POA: Diagnosis not present

## 2015-10-13 DIAGNOSIS — M50322 Other cervical disc degeneration at C5-C6 level: Secondary | ICD-10-CM | POA: Diagnosis not present

## 2015-10-15 ENCOUNTER — Other Ambulatory Visit: Payer: Self-pay | Admitting: Internal Medicine

## 2015-10-28 DIAGNOSIS — M461 Sacroiliitis, not elsewhere classified: Secondary | ICD-10-CM | POA: Diagnosis not present

## 2015-10-28 DIAGNOSIS — M50322 Other cervical disc degeneration at C5-C6 level: Secondary | ICD-10-CM | POA: Diagnosis not present

## 2015-10-28 DIAGNOSIS — M791 Myalgia: Secondary | ICD-10-CM | POA: Diagnosis not present

## 2015-10-28 DIAGNOSIS — M50222 Other cervical disc displacement at C5-C6 level: Secondary | ICD-10-CM | POA: Diagnosis not present

## 2015-11-09 ENCOUNTER — Ambulatory Visit: Payer: BLUE CROSS/BLUE SHIELD | Admitting: Gastroenterology

## 2015-11-13 DIAGNOSIS — M542 Cervicalgia: Secondary | ICD-10-CM | POA: Diagnosis not present

## 2015-11-13 DIAGNOSIS — M791 Myalgia: Secondary | ICD-10-CM | POA: Diagnosis not present

## 2015-11-13 DIAGNOSIS — M461 Sacroiliitis, not elsewhere classified: Secondary | ICD-10-CM | POA: Diagnosis not present

## 2015-11-13 DIAGNOSIS — M50322 Other cervical disc degeneration at C5-C6 level: Secondary | ICD-10-CM | POA: Diagnosis not present

## 2015-11-13 DIAGNOSIS — M50222 Other cervical disc displacement at C5-C6 level: Secondary | ICD-10-CM | POA: Diagnosis not present

## 2015-11-20 DIAGNOSIS — M50322 Other cervical disc degeneration at C5-C6 level: Secondary | ICD-10-CM | POA: Diagnosis not present

## 2015-11-20 DIAGNOSIS — M50222 Other cervical disc displacement at C5-C6 level: Secondary | ICD-10-CM | POA: Diagnosis not present

## 2015-11-20 DIAGNOSIS — M791 Myalgia: Secondary | ICD-10-CM | POA: Diagnosis not present

## 2015-11-20 DIAGNOSIS — M461 Sacroiliitis, not elsewhere classified: Secondary | ICD-10-CM | POA: Diagnosis not present

## 2015-11-24 ENCOUNTER — Other Ambulatory Visit: Payer: Self-pay | Admitting: Internal Medicine

## 2015-11-24 DIAGNOSIS — M47812 Spondylosis without myelopathy or radiculopathy, cervical region: Secondary | ICD-10-CM | POA: Diagnosis not present

## 2015-11-24 DIAGNOSIS — G894 Chronic pain syndrome: Secondary | ICD-10-CM | POA: Diagnosis not present

## 2015-11-24 DIAGNOSIS — M47817 Spondylosis without myelopathy or radiculopathy, lumbosacral region: Secondary | ICD-10-CM | POA: Diagnosis not present

## 2015-11-24 DIAGNOSIS — Z79891 Long term (current) use of opiate analgesic: Secondary | ICD-10-CM | POA: Diagnosis not present

## 2015-11-24 DIAGNOSIS — M797 Fibromyalgia: Secondary | ICD-10-CM | POA: Diagnosis not present

## 2015-11-26 ENCOUNTER — Ambulatory Visit (INDEPENDENT_AMBULATORY_CARE_PROVIDER_SITE_OTHER): Payer: BLUE CROSS/BLUE SHIELD | Admitting: Gastroenterology

## 2015-11-26 ENCOUNTER — Encounter: Payer: Self-pay | Admitting: Gastroenterology

## 2015-11-26 VITALS — BP 128/86 | HR 88

## 2015-11-26 DIAGNOSIS — K5901 Slow transit constipation: Secondary | ICD-10-CM | POA: Diagnosis not present

## 2015-11-26 DIAGNOSIS — K625 Hemorrhage of anus and rectum: Secondary | ICD-10-CM | POA: Diagnosis not present

## 2015-11-26 DIAGNOSIS — R140 Abdominal distension (gaseous): Secondary | ICD-10-CM

## 2015-11-26 DIAGNOSIS — K5730 Diverticulosis of large intestine without perforation or abscess without bleeding: Secondary | ICD-10-CM

## 2015-11-26 DIAGNOSIS — K730 Chronic persistent hepatitis, not elsewhere classified: Secondary | ICD-10-CM | POA: Diagnosis not present

## 2015-11-26 MED ORDER — RIFAXIMIN 200 MG PO TABS: 550.0000 mg | ORAL_TABLET | Freq: Three times a day (TID) | ORAL | Status: DC

## 2015-11-26 NOTE — Patient Instructions (Addendum)
If you are age 57 or older, your body mass index should be between 23-30. Your There is no weight on file to calculate BMI. If this is out of the aforementioned range listed, please consider follow up with your Primary Care Provider.  If you are age 57 or younger, your body mass index should be between 19-25. Your There is no weight on file to calculate BMI. If this is out of the aformentioned range listed, please consider follow up with your Primary Care Provider.   During one of your episodes of severe abdominal bloating and pain, take the following to achieve a colon cleanse:  One bottle of magnesium citrate (available over the counter).  If cost acceptable, please take the rifaximin antibiotic as prescribed.  Thank you for choosing [facility]  Dr Amada JupiterHenry Danis III

## 2015-11-26 NOTE — Progress Notes (Signed)
Tammy Farley GI Progress Note  Chief Complaint: Constipation and abdominal pain  Subjective History:  This pleasant lady is seeing me for the first time, having previously seen Dr. Juanda ChanceBrodie. Her colonoscopy in October 2015 was done for abdominal pain and rectal bleeding. It showed scattered diverticulosis throughout the colon. No mention was made of hemorrhoids, but they appear to be present on the retroflexion photo. Because she has been on chronic narcotic pain medicine for fibromyalgia for years, Dr. Juanda ChanceBrodie put her on Amitiza, which has apparently been very helpful. She has 2 or 3 bowel movements per day that vary from soft to loose, but she is satisfied with that. What concerns her most is that perhaps every 6 months she will get a severe episode of generalized abdominal pain and bloating which will only resolved if she can have more significant bowel movement. Every 1-2 months she has episodes of very bothersome visible abdominal distention. About 2 months ago she had a single episode of rectal bleeding that she attributes to hemorrhoids during a bowel movement.  ROS: Cardiovascular:  no chest pain Respiratory: no dyspnea  The patient's Past Medical, Family and Social History were reviewed and are on file in the EMR. She is on medicines for fibromyalgia and anxiety Objective:  Med list reviewed  Vital signs in last 24 hrs: Filed Vitals:   11/26/15 1128  BP: 128/86  Pulse: 88    Physical Exam   HEENT: sclera anicteric, oral mucosa moist without lesions  Neck: supple, no thyromegaly, JVD or lymphadenopathy  Cardiac: RRR without murmurs, S1S2 heard, no peripheral edema  Pulm: clear to auscultation bilaterally, normal RR and effort noted  Abdomen: soft, No tenderness, with active bowel sounds. No guarding or palpable hepatosplenomegaly.  Skin; warm and dry, no jaundice or rash  Radiology:  CT scan abdomen and pelvis on 09/29/2015 only shows  diverticulosis   @ASSESSMENTPLANBEGIN @ Assessment: Encounter Diagnoses  Name Primary?  . Slow transit constipation Yes  . Diverticulosis of colon without hemorrhage   . Abdominal bloating   . 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 Bacterial overgrowth is less likely. She appears to have had a single episode of benign anorectal bleeding related to constipation. There were no polyps or other neoplasia on a colonoscopy just over 18 months ago. Plan: She will continue him to use at the current dose A trial of rifaximin 200 mg 3 times daily for 10 days if her insurance will approve. When she has one of these severe episodes of abdominal pain and constipation, she will consume a bottle of magnesium citrate for a colon cleanse.  Charlie PitterHenry L Danis III

## 2015-11-27 ENCOUNTER — Telehealth: Payer: Self-pay | Admitting: Gastroenterology

## 2015-11-30 ENCOUNTER — Telehealth: Payer: Self-pay | Admitting: Gastroenterology

## 2015-11-30 NOTE — Telephone Encounter (Signed)
noted 

## 2015-11-30 NOTE — Telephone Encounter (Signed)
Xifaxan rx has been verified for 200 mg TID x 10 days, info given verbally to [place] with [facility].  She verbalized information.

## 2015-12-02 DIAGNOSIS — M50322 Other cervical disc degeneration at C5-C6 level: Secondary | ICD-10-CM | POA: Diagnosis not present

## 2015-12-02 DIAGNOSIS — M50222 Other cervical disc displacement at C5-C6 level: Secondary | ICD-10-CM | POA: Diagnosis not present

## 2015-12-02 DIAGNOSIS — M791 Myalgia: Secondary | ICD-10-CM | POA: Diagnosis not present

## 2015-12-02 DIAGNOSIS — M461 Sacroiliitis, not elsewhere classified: Secondary | ICD-10-CM | POA: Diagnosis not present

## 2015-12-02 DIAGNOSIS — M542 Cervicalgia: Secondary | ICD-10-CM | POA: Diagnosis not present

## 2015-12-11 DIAGNOSIS — M791 Myalgia: Secondary | ICD-10-CM | POA: Diagnosis not present

## 2015-12-11 DIAGNOSIS — M461 Sacroiliitis, not elsewhere classified: Secondary | ICD-10-CM | POA: Diagnosis not present

## 2015-12-11 DIAGNOSIS — M50222 Other cervical disc displacement at C5-C6 level: Secondary | ICD-10-CM | POA: Diagnosis not present

## 2015-12-11 DIAGNOSIS — M50322 Other cervical disc degeneration at C5-C6 level: Secondary | ICD-10-CM | POA: Diagnosis not present

## 2015-12-21 DIAGNOSIS — S32018A Other fracture of first lumbar vertebra, initial encounter for closed fracture: Secondary | ICD-10-CM | POA: Diagnosis not present

## 2015-12-21 DIAGNOSIS — M797 Fibromyalgia: Secondary | ICD-10-CM | POA: Diagnosis not present

## 2015-12-21 DIAGNOSIS — F329 Major depressive disorder, single episode, unspecified: Secondary | ICD-10-CM | POA: Diagnosis not present

## 2015-12-21 DIAGNOSIS — K654 Sclerosing mesenteritis: Secondary | ICD-10-CM | POA: Diagnosis not present

## 2015-12-21 DIAGNOSIS — S3993XA Unspecified injury of pelvis, initial encounter: Secondary | ICD-10-CM | POA: Diagnosis not present

## 2015-12-21 DIAGNOSIS — S32010A Wedge compression fracture of first lumbar vertebra, initial encounter for closed fracture: Secondary | ICD-10-CM | POA: Diagnosis not present

## 2015-12-21 DIAGNOSIS — E785 Hyperlipidemia, unspecified: Secondary | ICD-10-CM | POA: Diagnosis not present

## 2015-12-21 DIAGNOSIS — S32019A Unspecified fracture of first lumbar vertebra, initial encounter for closed fracture: Secondary | ICD-10-CM | POA: Diagnosis not present

## 2015-12-21 DIAGNOSIS — S299XXA Unspecified injury of thorax, initial encounter: Secondary | ICD-10-CM | POA: Diagnosis not present

## 2015-12-21 DIAGNOSIS — S22088A Other fracture of T11-T12 vertebra, initial encounter for closed fracture: Secondary | ICD-10-CM | POA: Diagnosis not present

## 2015-12-21 DIAGNOSIS — Z90710 Acquired absence of both cervix and uterus: Secondary | ICD-10-CM | POA: Diagnosis not present

## 2015-12-21 DIAGNOSIS — S22080A Wedge compression fracture of T11-T12 vertebra, initial encounter for closed fracture: Secondary | ICD-10-CM | POA: Diagnosis not present

## 2015-12-21 DIAGNOSIS — S3991XA Unspecified injury of abdomen, initial encounter: Secondary | ICD-10-CM | POA: Diagnosis not present

## 2015-12-21 DIAGNOSIS — M545 Low back pain: Secondary | ICD-10-CM | POA: Diagnosis not present

## 2015-12-21 DIAGNOSIS — N390 Urinary tract infection, site not specified: Secondary | ICD-10-CM | POA: Diagnosis not present

## 2015-12-21 DIAGNOSIS — N3000 Acute cystitis without hematuria: Secondary | ICD-10-CM | POA: Diagnosis not present

## 2015-12-22 DIAGNOSIS — M4316 Spondylolisthesis, lumbar region: Secondary | ICD-10-CM | POA: Diagnosis not present

## 2015-12-22 DIAGNOSIS — S22088A Other fracture of T11-T12 vertebra, initial encounter for closed fracture: Secondary | ICD-10-CM | POA: Diagnosis not present

## 2015-12-22 DIAGNOSIS — S3993XA Unspecified injury of pelvis, initial encounter: Secondary | ICD-10-CM | POA: Diagnosis not present

## 2015-12-22 DIAGNOSIS — R32 Unspecified urinary incontinence: Secondary | ICD-10-CM | POA: Diagnosis not present

## 2015-12-22 DIAGNOSIS — S32019A Unspecified fracture of first lumbar vertebra, initial encounter for closed fracture: Secondary | ICD-10-CM | POA: Diagnosis not present

## 2015-12-22 DIAGNOSIS — N3000 Acute cystitis without hematuria: Secondary | ICD-10-CM | POA: Diagnosis not present

## 2015-12-22 DIAGNOSIS — S22080A Wedge compression fracture of T11-T12 vertebra, initial encounter for closed fracture: Secondary | ICD-10-CM | POA: Diagnosis not present

## 2015-12-22 DIAGNOSIS — E785 Hyperlipidemia, unspecified: Secondary | ICD-10-CM | POA: Diagnosis not present

## 2015-12-22 DIAGNOSIS — M5489 Other dorsalgia: Secondary | ICD-10-CM | POA: Diagnosis not present

## 2015-12-22 DIAGNOSIS — M545 Low back pain: Secondary | ICD-10-CM | POA: Diagnosis not present

## 2015-12-22 DIAGNOSIS — S3991XA Unspecified injury of abdomen, initial encounter: Secondary | ICD-10-CM | POA: Diagnosis not present

## 2015-12-22 DIAGNOSIS — S32018A Other fracture of first lumbar vertebra, initial encounter for closed fracture: Secondary | ICD-10-CM | POA: Diagnosis not present

## 2015-12-22 DIAGNOSIS — M797 Fibromyalgia: Secondary | ICD-10-CM | POA: Diagnosis not present

## 2015-12-22 DIAGNOSIS — Z90710 Acquired absence of both cervix and uterus: Secondary | ICD-10-CM | POA: Diagnosis not present

## 2015-12-22 DIAGNOSIS — S32010A Wedge compression fracture of first lumbar vertebra, initial encounter for closed fracture: Secondary | ICD-10-CM | POA: Diagnosis not present

## 2015-12-22 DIAGNOSIS — F329 Major depressive disorder, single episode, unspecified: Secondary | ICD-10-CM | POA: Diagnosis not present

## 2015-12-23 DIAGNOSIS — M4316 Spondylolisthesis, lumbar region: Secondary | ICD-10-CM | POA: Diagnosis not present

## 2015-12-23 DIAGNOSIS — S32018A Other fracture of first lumbar vertebra, initial encounter for closed fracture: Secondary | ICD-10-CM | POA: Diagnosis not present

## 2015-12-23 DIAGNOSIS — S22080A Wedge compression fracture of T11-T12 vertebra, initial encounter for closed fracture: Secondary | ICD-10-CM | POA: Diagnosis not present

## 2015-12-23 DIAGNOSIS — S22088A Other fracture of T11-T12 vertebra, initial encounter for closed fracture: Secondary | ICD-10-CM | POA: Diagnosis not present

## 2015-12-23 DIAGNOSIS — S32010A Wedge compression fracture of first lumbar vertebra, initial encounter for closed fracture: Secondary | ICD-10-CM | POA: Diagnosis not present

## 2015-12-25 ENCOUNTER — Telehealth: Payer: Self-pay

## 2015-12-25 NOTE — Telephone Encounter (Signed)
[facility] called back and wants records faxed over. Patient made aware to have [facility] [facility] records.

## 2015-12-25 NOTE — Telephone Encounter (Signed)
Per Dr. Lenord FellersBaxley can see anyone at neurosurgery- [facility]

## 2015-12-25 NOTE — Telephone Encounter (Signed)
Left message for new patient referral coordinator to call seeing how there is no OV notes to fax.
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
## 2016-01-05 DIAGNOSIS — Z6831 Body mass index (BMI) 31.0-31.9, adult: Secondary | ICD-10-CM | POA: Diagnosis not present

## 2016-01-05 DIAGNOSIS — M4854XA Collapsed vertebra, not elsewhere classified, thoracic region, initial encounter for fracture: Secondary | ICD-10-CM | POA: Diagnosis not present

## 2016-01-05 DIAGNOSIS — S32010A Wedge compression fracture of first lumbar vertebra, initial encounter for closed fracture: Secondary | ICD-10-CM | POA: Diagnosis not present

## 2016-01-21 ENCOUNTER — Ambulatory Visit (INDEPENDENT_AMBULATORY_CARE_PROVIDER_SITE_OTHER): Payer: BLUE CROSS/BLUE SHIELD | Admitting: Internal Medicine

## 2016-01-21 ENCOUNTER — Encounter: Payer: Self-pay | Admitting: Internal Medicine

## 2016-01-21 VITALS — BP 110/72 | HR 100 | Temp 98.0°F | Ht 62.0 in | Wt 181.0 lb

## 2016-01-21 DIAGNOSIS — Z87440 Personal history of urinary (tract) infections: Secondary | ICD-10-CM

## 2016-01-21 DIAGNOSIS — N390 Urinary tract infection, site not specified: Secondary | ICD-10-CM

## 2016-01-21 DIAGNOSIS — R350 Frequency of micturition: Secondary | ICD-10-CM | POA: Diagnosis not present

## 2016-01-21 DIAGNOSIS — Z8781 Personal history of (healed) traumatic fracture: Secondary | ICD-10-CM

## 2016-01-21 DIAGNOSIS — M797 Fibromyalgia: Secondary | ICD-10-CM | POA: Diagnosis not present

## 2016-01-21 LAB — POCT URINALYSIS DIPSTICK
BILIRUBIN UA: NEGATIVE
Blood, UA: NEGATIVE
Glucose, UA: NEGATIVE
Ketones, UA: NEGATIVE
NITRITE UA: POSITIVE
PH UA: 5
PROTEIN UA: NEGATIVE
Spec Grav, UA: 1.01
UROBILINOGEN UA: 0.2

## 2016-01-21 MED ORDER — AMOXICILLIN-POT CLAVULANATE 500-125 MG PO TABS: 1.0000 | ORAL_TABLET | Freq: Three times a day (TID) | ORAL | Status: DC
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
## 2016-01-21 NOTE — Patient Instructions (Signed)
Speak with pain management physician regarding tapering of her chronic pain medication. Augmentin 500 mg 3 times daily for 10 days for urinary tract infection. Urine culture pending.

## 2016-01-23 LAB — URINE CULTURE

## 2016-01-25 ENCOUNTER — Telehealth: Payer: Self-pay

## 2016-01-25 NOTE — Telephone Encounter (Signed)
-----   Message from Margaree MackintoshMary J Baxley, MD sent at 01/24/2016  1:27 PM EDT ----- Organism sensitive to Augmentin which she is on. Finish course as prescribed

## 2016-01-25 NOTE — Telephone Encounter (Signed)
Called patient. Gave lab results. Patient verbalized understanding.  

## 2016-02-03 ENCOUNTER — Telehealth: Payer: Self-pay

## 2016-02-03 NOTE — Telephone Encounter (Signed)
Incoming fax request for refills on Xifaxan 200 mg 1 tid for 10 days. Left a message for pt to return call, for symptom update.

## 2016-02-03 NOTE — Telephone Encounter (Signed)
Pt returned call. She states that the Xifaxan worked so well and was happy with the results. For the first time in ages she has been having formed stools and feels the best she has in a very long time. However she has noticed and increase in bloating recently. She is interested in taking another course. Please advise.

## 2016-02-04 ENCOUNTER — Other Ambulatory Visit: Payer: Self-pay

## 2016-02-04 MED ORDER — RIFAXIMIN 200 MG PO TABS: 550.0000 mg | ORAL_TABLET | Freq: Three times a day (TID) | ORAL | 0 refills | Status: DC

## 2016-02-04 NOTE — Telephone Encounter (Signed)
Yes, she can take one more course of 200 mg three times daily for 10 days

## 2016-02-04 NOTE — Telephone Encounter (Signed)
Pt notified and aware the RX was sent to [facility]

## 2016-02-09 ENCOUNTER — Telehealth: Payer: Self-pay | Admitting: Gastroenterology

## 2016-02-09 ENCOUNTER — Other Ambulatory Visit: Payer: Self-pay | Admitting: Neurosurgery

## 2016-02-09 DIAGNOSIS — M4854XA Collapsed vertebra, not elsewhere classified, thoracic region, initial encounter for fracture: Secondary | ICD-10-CM | POA: Diagnosis not present

## 2016-02-09 DIAGNOSIS — Z6832 Body mass index (BMI) 32.0-32.9, adult: Secondary | ICD-10-CM | POA: Diagnosis not present

## 2016-02-09 DIAGNOSIS — S32010A Wedge compression fracture of first lumbar vertebra, initial encounter for closed fracture: Secondary | ICD-10-CM | POA: Diagnosis not present

## 2016-02-09 NOTE — Telephone Encounter (Signed)
Records faxed to [facility] as requested.

## 2016-02-12 ENCOUNTER — Other Ambulatory Visit: Payer: Self-pay

## 2016-02-12 DIAGNOSIS — M47812 Spondylosis without myelopathy or radiculopathy, cervical region: Secondary | ICD-10-CM | POA: Diagnosis not present

## 2016-02-12 DIAGNOSIS — M47817 Spondylosis without myelopathy or radiculopathy, lumbosacral region: Secondary | ICD-10-CM | POA: Diagnosis not present

## 2016-02-12 DIAGNOSIS — G894 Chronic pain syndrome: Secondary | ICD-10-CM | POA: Diagnosis not present

## 2016-02-12 DIAGNOSIS — M797 Fibromyalgia: Secondary | ICD-10-CM | POA: Diagnosis not present

## 2016-02-12 MED ORDER — RIFAXIMIN 550 MG PO TABS: 550.0000 mg | ORAL_TABLET | Freq: Two times a day (BID) | ORAL | 0 refills | Status: DC

## 2016-02-12 NOTE — Telephone Encounter (Signed)
New Rx was sent for a different strength of Xifaxan. Please see other phone note dated 02-12-2016

## 2016-02-12 NOTE — Telephone Encounter (Signed)
Suprina states that patient insurance has denied xifixan and this rx will be put on hold.

## 2016-02-12 NOTE — Telephone Encounter (Signed)
Rx was denied for Xifaxan 200 mg tid for 10 days. New Rx for Xifaxan 550 mg bid for 10 days was sent to [facility]. Left a voice mail to inform pt.

## 2016-02-15 ENCOUNTER — Telehealth: Payer: Self-pay | Admitting: Gastroenterology

## 2016-02-15 NOTE — Telephone Encounter (Signed)
Left message informing pt of change in medications dose. Advised to call if any other questions.

## 2016-02-17 NOTE — Telephone Encounter (Signed)
[name] has been approved for the Xifaxan 550 mg.

## 2016-03-15 ENCOUNTER — Ambulatory Visit (INDEPENDENT_AMBULATORY_CARE_PROVIDER_SITE_OTHER): Payer: BLUE CROSS/BLUE SHIELD

## 2016-03-15 DIAGNOSIS — M438X6 Other specified deforming dorsopathies, lumbar region: Secondary | ICD-10-CM | POA: Diagnosis not present

## 2016-03-15 DIAGNOSIS — S32010A Wedge compression fracture of first lumbar vertebra, initial encounter for closed fracture: Secondary | ICD-10-CM

## 2016-03-15 DIAGNOSIS — M546 Pain in thoracic spine: Secondary | ICD-10-CM

## 2016-03-15 DIAGNOSIS — M4854XA Collapsed vertebra, not elsewhere classified, thoracic region, initial encounter for fracture: Secondary | ICD-10-CM | POA: Diagnosis not present

## 2016-03-15 DIAGNOSIS — R937 Abnormal findings on diagnostic imaging of other parts of musculoskeletal system: Secondary | ICD-10-CM | POA: Diagnosis not present

## 2016-03-15 DIAGNOSIS — Z6831 Body mass index (BMI) 31.0-31.9, adult: Secondary | ICD-10-CM | POA: Diagnosis not present

## 2016-04-05 DIAGNOSIS — M47817 Spondylosis without myelopathy or radiculopathy, lumbosacral region: Secondary | ICD-10-CM | POA: Diagnosis not present

## 2016-04-05 DIAGNOSIS — G894 Chronic pain syndrome: Secondary | ICD-10-CM | POA: Diagnosis not present

## 2016-04-05 DIAGNOSIS — M47812 Spondylosis without myelopathy or radiculopathy, cervical region: Secondary | ICD-10-CM | POA: Diagnosis not present

## 2016-04-05 DIAGNOSIS — M797 Fibromyalgia: Secondary | ICD-10-CM | POA: Diagnosis not present

## 2016-04-12 ENCOUNTER — Ambulatory Visit (INDEPENDENT_AMBULATORY_CARE_PROVIDER_SITE_OTHER): Payer: BLUE CROSS/BLUE SHIELD

## 2016-04-12 ENCOUNTER — Other Ambulatory Visit: Payer: Self-pay | Admitting: Neurosurgery

## 2016-04-12 DIAGNOSIS — S32010A Wedge compression fracture of first lumbar vertebra, initial encounter for closed fracture: Secondary | ICD-10-CM | POA: Diagnosis not present

## 2016-04-12 DIAGNOSIS — Z9889 Other specified postprocedural states: Secondary | ICD-10-CM

## 2016-04-12 DIAGNOSIS — M5136 Other intervertebral disc degeneration, lumbar region: Secondary | ICD-10-CM | POA: Diagnosis not present

## 2016-04-12 DIAGNOSIS — M4307 Spondylolysis, lumbosacral region: Secondary | ICD-10-CM | POA: Diagnosis not present

## 2016-04-19 DIAGNOSIS — L821 Other seborrheic keratosis: Secondary | ICD-10-CM | POA: Diagnosis not present

## 2016-04-19 DIAGNOSIS — D2262 Melanocytic nevi of left upper limb, including shoulder: Secondary | ICD-10-CM | POA: Diagnosis not present

## 2016-04-19 DIAGNOSIS — D2239 Melanocytic nevi of other parts of face: Secondary | ICD-10-CM | POA: Diagnosis not present

## 2016-04-19 DIAGNOSIS — L820 Inflamed seborrheic keratosis: Secondary | ICD-10-CM | POA: Diagnosis not present

## 2016-04-27 ENCOUNTER — Other Ambulatory Visit: Payer: Self-pay | Admitting: Internal Medicine

## 2016-06-18 ENCOUNTER — Other Ambulatory Visit: Payer: Self-pay | Admitting: Internal Medicine

## 2016-06-21 DIAGNOSIS — M47817 Spondylosis without myelopathy or radiculopathy, lumbosacral region: Secondary | ICD-10-CM | POA: Diagnosis not present

## 2016-06-21 DIAGNOSIS — M47812 Spondylosis without myelopathy or radiculopathy, cervical region: Secondary | ICD-10-CM | POA: Diagnosis not present

## 2016-06-21 DIAGNOSIS — G894 Chronic pain syndrome: Secondary | ICD-10-CM | POA: Diagnosis not present

## 2016-06-21 DIAGNOSIS — M797 Fibromyalgia: Secondary | ICD-10-CM | POA: Diagnosis not present

## 2016-06-28 ENCOUNTER — Other Ambulatory Visit: Payer: Self-pay | Admitting: Internal Medicine

## 2016-08-16 ENCOUNTER — Other Ambulatory Visit: Payer: Self-pay | Admitting: Internal Medicine

## 2016-08-18 DIAGNOSIS — G894 Chronic pain syndrome: Secondary | ICD-10-CM | POA: Diagnosis not present

## 2016-08-18 DIAGNOSIS — M797 Fibromyalgia: Secondary | ICD-10-CM | POA: Diagnosis not present

## 2016-08-18 DIAGNOSIS — M47812 Spondylosis without myelopathy or radiculopathy, cervical region: Secondary | ICD-10-CM | POA: Diagnosis not present

## 2016-08-18 DIAGNOSIS — M47817 Spondylosis without myelopathy or radiculopathy, lumbosacral region: Secondary | ICD-10-CM | POA: Diagnosis not present

## 2016-08-24 ENCOUNTER — Other Ambulatory Visit: Payer: Self-pay

## 2016-08-24 ENCOUNTER — Telehealth: Payer: Self-pay | Admitting: Gastroenterology

## 2016-08-24 MED ORDER — CIPROFLOXACIN HCL 500 MG PO TABS: 500.0000 mg | ORAL_TABLET | Freq: Two times a day (BID) | ORAL | 0 refills | Status: DC

## 2016-08-24 MED ORDER — METRONIDAZOLE 500 MG PO TABS: 500.0000 mg | ORAL_TABLET | Freq: Three times a day (TID) | ORAL | 0 refills | Status: DC

## 2016-08-24 NOTE — Telephone Encounter (Signed)
Patient called to report on Monday, she had one episode of vomiting. She has been continuously running a fever of 101.9, LLQ pain, has not had a bm since Monday. Usually she has 3-4 bm's per day. She has only been taking in liquids. She has been taking Tylenol, ibuprofen for fever, when it wears off temp goes back up, this a.m. 101.5. She is taking her Amitiza, she did not fill her prescription for rifaximin. Please advise.

## 2016-08-24 NOTE — Telephone Encounter (Signed)
Patient just had bm, no blood. She does feel somewhat better.

## 2016-08-24 NOTE — Telephone Encounter (Signed)
Patient advised of Dr. Myrtie Neitheranis recommendations and will call back next week to give update, sooner if symptoms worsening. Rx sent to pharmacy.

## 2016-08-24 NOTE — Telephone Encounter (Signed)
Patient called in stating that she just now had a BM. Best # [phone]

## 2016-08-24 NOTE — Telephone Encounter (Signed)
I think it would be best to empirically treat her for what sounds like diverticulitis.  Ciprofloxacin 500 mg , one tablet twice daily for 10 days.  Disp #20, RF zero  Metronidazole 500 mg , once tablet three times daily for 10 days.  Disp #30 , RF zero  Should be feeling better within 2-3 days.  Call us back early next week with update or sooner if worsening despite treatment.

## 2016-08-31 ENCOUNTER — Telehealth: Payer: Self-pay | Admitting: Gastroenterology

## 2016-08-31 NOTE — Telephone Encounter (Signed)
Routed to Dr. Danis. 

## 2016-09-01 NOTE — Telephone Encounter (Signed)
Spoke to patient let her know that Dr. Myrtie Neitheranis would like for her to check back in with Tammy Farley after she finishes her antibiotics to let Tammy Farley know how she is doing. Patient understands to call after finishing them.

## 2016-09-01 NOTE — Telephone Encounter (Signed)
Finish antibiotics and let us know if further problems.

## 2016-09-13 ENCOUNTER — Other Ambulatory Visit: Payer: Self-pay | Admitting: Internal Medicine

## 2016-09-16 ENCOUNTER — Encounter: Payer: Self-pay | Admitting: Internal Medicine

## 2016-09-16 DIAGNOSIS — Z1231 Encounter for screening mammogram for malignant neoplasm of breast: Secondary | ICD-10-CM | POA: Diagnosis not present

## 2016-09-29 ENCOUNTER — Other Ambulatory Visit: Payer: Self-pay | Admitting: Internal Medicine

## 2016-09-29 NOTE — Telephone Encounter (Signed)
Past due for CPE --last was Oct 2016. Please schedule before refilling

## 2016-10-04 NOTE — Telephone Encounter (Signed)
Left message, pt needs to schedule a physical before we can refill this medications.

## 2016-10-09 ENCOUNTER — Other Ambulatory Visit: Payer: Self-pay | Admitting: Internal Medicine

## 2016-10-10 ENCOUNTER — Encounter: Payer: Self-pay | Admitting: Internal Medicine

## 2016-10-10 ENCOUNTER — Ambulatory Visit (INDEPENDENT_AMBULATORY_CARE_PROVIDER_SITE_OTHER): Payer: BLUE CROSS/BLUE SHIELD | Admitting: Internal Medicine

## 2016-10-10 VITALS — BP 120/88 | HR 99 | Temp 99.2°F | Wt 174.0 lb

## 2016-10-10 DIAGNOSIS — R1030 Lower abdominal pain, unspecified: Secondary | ICD-10-CM

## 2016-10-10 DIAGNOSIS — N390 Urinary tract infection, site not specified: Secondary | ICD-10-CM | POA: Diagnosis not present

## 2016-10-10 DIAGNOSIS — R8290 Unspecified abnormal findings in urine: Secondary | ICD-10-CM | POA: Diagnosis not present

## 2016-10-10 LAB — POCT URINALYSIS DIPSTICK
Bilirubin, UA: NEGATIVE
GLUCOSE UA: NEGATIVE
KETONES UA: NEGATIVE
Nitrite, UA: NEGATIVE
Protein, UA: NEGATIVE
RBC UA: NEGATIVE
SPEC GRAV UA: 1.01 (ref 1.010–1.025)
Urobilinogen, UA: 0.2 E.U./dL
pH, UA: 7 (ref 5.0–8.0)

## 2016-10-10 MED ORDER — FLUCONAZOLE 150 MG PO TABS: 150.0000 mg | ORAL_TABLET | Freq: Once | ORAL | 1 refills | Status: AC

## 2016-10-10 MED ORDER — CIPROFLOXACIN HCL 500 MG PO TABS: 500.0000 mg | ORAL_TABLET | Freq: Two times a day (BID) | ORAL | 0 refills | Status: DC

## 2016-10-10 NOTE — Patient Instructions (Signed)
Culture pending. Cipro 500 mg twice daily for 10 days. Diflucan 150 mg tablet if needed for Candida vaginitis.

## 2016-10-10 NOTE — Progress Notes (Signed)
   Subjective:    Patient ID: Tammy Farley, female    DOB: 03/11/1961, 56 y.o.   MRN: [id number]  HPI  Onset yesterday UTI symptoms.No fever or shaking chills. Has had some right-sided back pain. No blood in urine. Urinalysis is abnormal. Culture sent.    Review of Systems     Objective:   Physical Exam  No significant CVA tenderness. Urine  dipstick abnormal with 2+ LE by clean catch      Assessment & Plan:  Acute UTI  Plan: Cipro 500 mg twice daily for 10 days. Diflucan 150 mg tablet if needed for Candida vaginitis following antibiotic therapy. Culture pending. She is leaving Wednesday to go to [place] for wedding. She will be gone through Sunday.

## 2016-10-11 ENCOUNTER — Other Ambulatory Visit: Payer: Self-pay | Admitting: Internal Medicine

## 2016-10-12 LAB — URINE CULTURE

## 2016-10-14 IMAGING — DX DG THORACIC SPINE 2V
3 series · 3 of 3 positions shown · non-contrast
Comparison: MR lumbar spine of 12/23/2015

CLINICAL DATA: History of compression fracture of L1, followup

EXAM:
THORACIC SPINE 2 VIEWS

[t-spine ap]
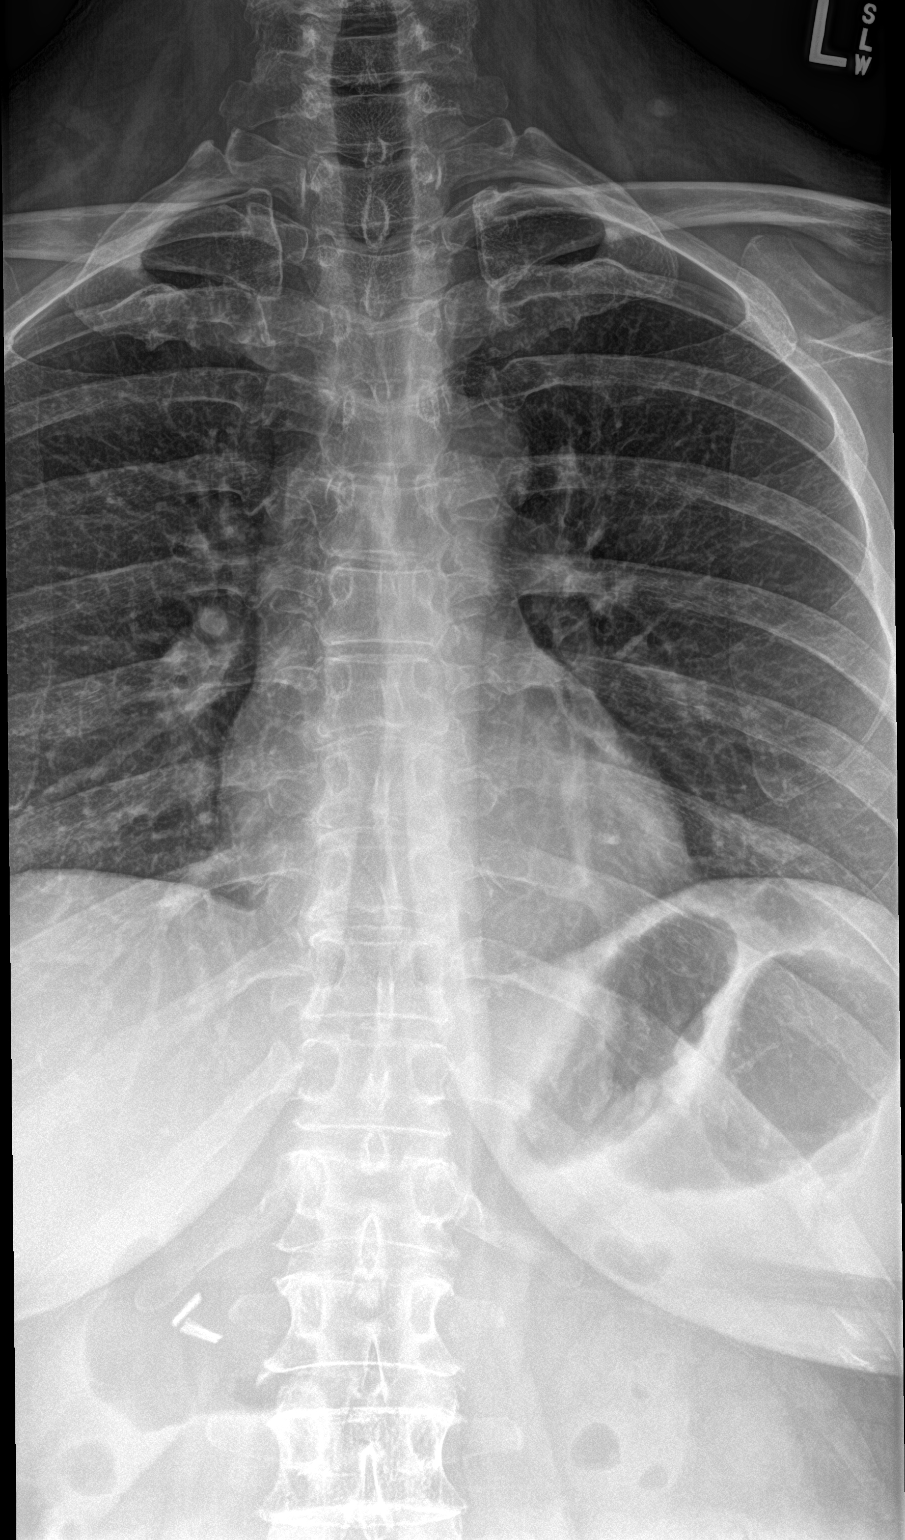

[t-spine lat]
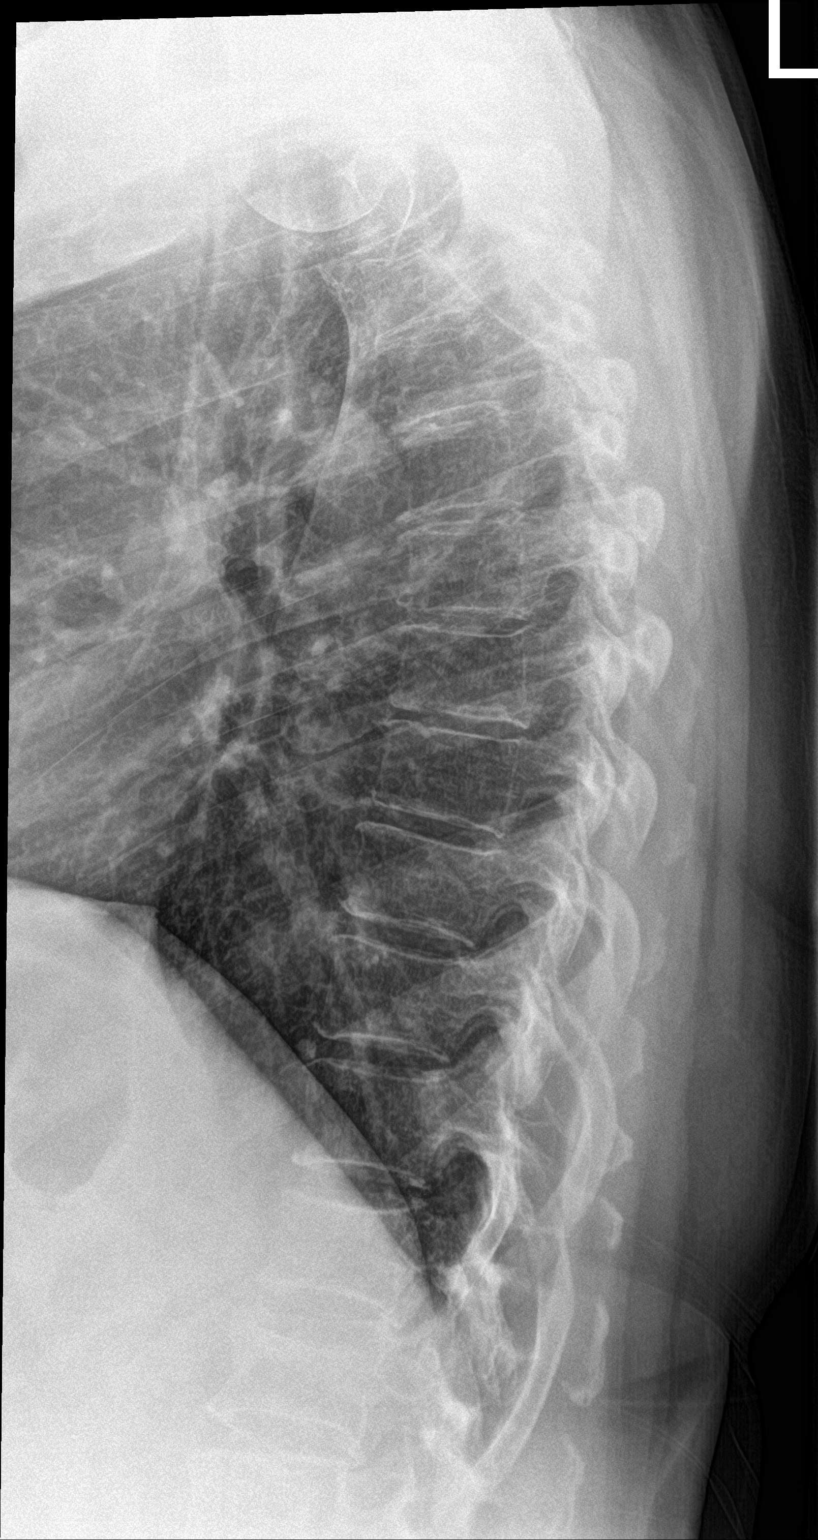

[t-spine swimmers]
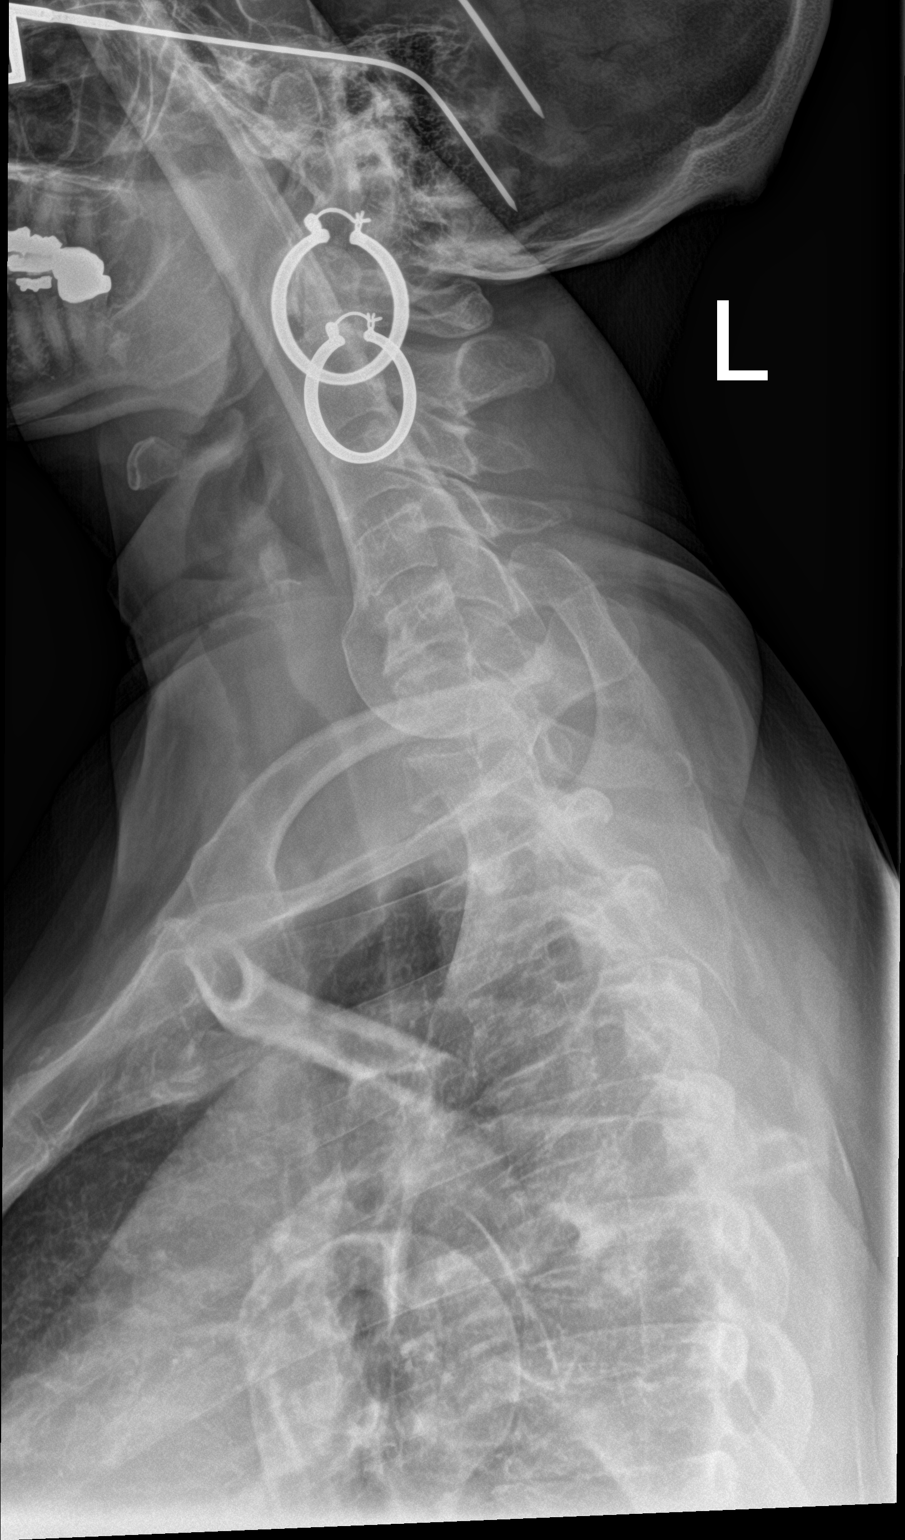

[3 of 3 positions shown; findings below may reference images not displayed]

FINDINGS: The thoracic vertebrae are in normal alignment. No thoracic
compression deformity is seen. No prominent paravertebral soft
tissue is noted.
IMPRESSION: Negative.

## 2016-10-18 DIAGNOSIS — M47812 Spondylosis without myelopathy or radiculopathy, cervical region: Secondary | ICD-10-CM | POA: Diagnosis not present

## 2016-10-18 DIAGNOSIS — M47817 Spondylosis without myelopathy or radiculopathy, lumbosacral region: Secondary | ICD-10-CM | POA: Diagnosis not present

## 2016-10-18 DIAGNOSIS — G894 Chronic pain syndrome: Secondary | ICD-10-CM | POA: Diagnosis not present

## 2016-10-18 DIAGNOSIS — M797 Fibromyalgia: Secondary | ICD-10-CM | POA: Diagnosis not present

## 2016-11-14 ENCOUNTER — Other Ambulatory Visit: Payer: Self-pay | Admitting: Internal Medicine

## 2016-12-01 ENCOUNTER — Other Ambulatory Visit: Payer: BLUE CROSS/BLUE SHIELD | Admitting: Internal Medicine

## 2016-12-05 ENCOUNTER — Other Ambulatory Visit: Payer: BLUE CROSS/BLUE SHIELD | Admitting: Internal Medicine

## 2016-12-06 ENCOUNTER — Encounter: Payer: BLUE CROSS/BLUE SHIELD | Admitting: Internal Medicine

## 2016-12-14 ENCOUNTER — Other Ambulatory Visit: Payer: Self-pay | Admitting: Internal Medicine

## 2016-12-30 ENCOUNTER — Other Ambulatory Visit: Payer: Self-pay | Admitting: Internal Medicine

## 2017-01-04 DIAGNOSIS — M47812 Spondylosis without myelopathy or radiculopathy, cervical region: Secondary | ICD-10-CM | POA: Diagnosis not present

## 2017-01-04 DIAGNOSIS — M797 Fibromyalgia: Secondary | ICD-10-CM | POA: Diagnosis not present

## 2017-01-04 DIAGNOSIS — G894 Chronic pain syndrome: Secondary | ICD-10-CM | POA: Diagnosis not present

## 2017-01-04 DIAGNOSIS — M47817 Spondylosis without myelopathy or radiculopathy, lumbosacral region: Secondary | ICD-10-CM | POA: Diagnosis not present

## 2017-01-09 ENCOUNTER — Other Ambulatory Visit: Payer: Self-pay | Admitting: Internal Medicine

## 2017-01-24 ENCOUNTER — Ambulatory Visit (INDEPENDENT_AMBULATORY_CARE_PROVIDER_SITE_OTHER): Payer: BLUE CROSS/BLUE SHIELD | Admitting: Internal Medicine

## 2017-01-24 ENCOUNTER — Other Ambulatory Visit: Payer: BLUE CROSS/BLUE SHIELD | Admitting: Internal Medicine

## 2017-01-24 ENCOUNTER — Encounter: Payer: Self-pay | Admitting: Internal Medicine

## 2017-01-24 VITALS — BP 112/80 | HR 102 | Temp 99.5°F | Ht 63.5 in | Wt 176.0 lb

## 2017-01-24 DIAGNOSIS — M797 Fibromyalgia: Secondary | ICD-10-CM | POA: Diagnosis not present

## 2017-01-24 DIAGNOSIS — Z0000 Encounter for general adult medical examination without abnormal findings: Secondary | ICD-10-CM | POA: Diagnosis not present

## 2017-01-24 DIAGNOSIS — Z8669 Personal history of other diseases of the nervous system and sense organs: Secondary | ICD-10-CM | POA: Diagnosis not present

## 2017-01-24 DIAGNOSIS — R8290 Unspecified abnormal findings in urine: Secondary | ICD-10-CM | POA: Diagnosis not present

## 2017-01-24 DIAGNOSIS — K5900 Constipation, unspecified: Secondary | ICD-10-CM | POA: Diagnosis not present

## 2017-01-24 DIAGNOSIS — N898 Other specified noninflammatory disorders of vagina: Secondary | ICD-10-CM

## 2017-01-24 DIAGNOSIS — M5442 Lumbago with sciatica, left side: Secondary | ICD-10-CM | POA: Diagnosis not present

## 2017-01-24 DIAGNOSIS — Z8719 Personal history of other diseases of the digestive system: Secondary | ICD-10-CM | POA: Diagnosis not present

## 2017-01-24 DIAGNOSIS — G8929 Other chronic pain: Secondary | ICD-10-CM

## 2017-01-24 DIAGNOSIS — E784 Other hyperlipidemia: Secondary | ICD-10-CM | POA: Diagnosis not present

## 2017-01-24 DIAGNOSIS — Z1329 Encounter for screening for other suspected endocrine disorder: Secondary | ICD-10-CM

## 2017-01-24 DIAGNOSIS — E7849 Other hyperlipidemia: Secondary | ICD-10-CM

## 2017-01-24 DIAGNOSIS — E785 Hyperlipidemia, unspecified: Secondary | ICD-10-CM

## 2017-01-24 DIAGNOSIS — E559 Vitamin D deficiency, unspecified: Secondary | ICD-10-CM

## 2017-01-24 LAB — CBC WITH DIFFERENTIAL/PLATELET
BASOS PCT: 0 %
Basophils Absolute: 0 cells/uL (ref 0–200)
EOS ABS: 128 {cells}/uL (ref 15–500)
Eosinophils Relative: 2 %
HCT: 39.8 % (ref 35.0–45.0)
Hemoglobin: 13.2 g/dL (ref 11.7–15.5)
Lymphocytes Relative: 33 %
Lymphs Abs: 2112 cells/uL (ref 850–3900)
MCH: 30.1 pg (ref 27.0–33.0)
MCHC: 33.2 g/dL (ref 32.0–36.0)
MCV: 90.7 fL (ref 80.0–100.0)
MONOS PCT: 7 %
MPV: 9.6 fL (ref 7.5–12.5)
Monocytes Absolute: 448 cells/uL (ref 200–950)
Neutro Abs: 3712 cells/uL (ref 1500–7800)
Neutrophils Relative %: 58 %
PLATELETS: 228 10*3/uL (ref 140–400)
RBC: 4.39 MIL/uL (ref 3.80–5.10)
RDW: 13.7 % (ref 11.0–15.0)
WBC: 6.4 10*3/uL (ref 3.8–10.8)

## 2017-01-24 LAB — COMPLETE METABOLIC PANEL WITH GFR
ALT: 18 U/L (ref 6–29)
AST: 14 U/L (ref 10–35)
Albumin: 4.5 g/dL (ref 3.6–5.1)
Alkaline Phosphatase: 59 U/L (ref 33–130)
BILIRUBIN TOTAL: 0.4 mg/dL (ref 0.2–1.2)
BUN: 10 mg/dL (ref 7–25)
CO2: 23 mmol/L (ref 20–32)
CREATININE: 0.71 mg/dL (ref 0.50–1.05)
Calcium: 9.7 mg/dL (ref 8.6–10.4)
Chloride: 106 mmol/L (ref 98–110)
GFR, Est Non African American: >89 mL/min (ref >=60)
GLUCOSE: 83 mg/dL (ref 65–99)
Potassium: 4.9 mmol/L (ref 3.5–5.3)
SODIUM: 139 mmol/L (ref 135–146)
TOTAL PROTEIN: 6.8 g/dL (ref 6.1–8.1)

## 2017-01-24 LAB — POCT URINALYSIS DIPSTICK
BILIRUBIN UA: NEGATIVE
GLUCOSE UA: NEGATIVE
KETONES UA: NEGATIVE
Leukocytes, UA: NEGATIVE
Nitrite, UA: NEGATIVE
PH UA: 5 (ref 5.0–8.0)
Protein, UA: NEGATIVE
RBC UA: NEGATIVE
Spec Grav, UA: <=1.005 — AB (ref 1.010–1.025)
UROBILINOGEN UA: 0.2 U/dL

## 2017-01-24 LAB — LIPID PANEL
Cholesterol: 179 mg/dL (ref <200)
HDL: 61 mg/dL (ref >50)
LDL CALC: 90 mg/dL (ref <100)
Total CHOL/HDL Ratio: 2.9 Ratio (ref <5.0)
Triglycerides: 141 mg/dL (ref <150)
VLDL: 28 mg/dL (ref <30)

## 2017-01-24 LAB — TSH: TSH: 2.01 m[IU]/L
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
## 2017-01-25 LAB — VITAMIN D 25 HYDROXY (VIT D DEFICIENCY, FRACTURES): Vit D, 25-Hydroxy: 31 ng/mL (ref 30–100)

## 2017-01-25 LAB — URINE CULTURE: Organism ID, Bacteria: NO GROWTH

## 2017-01-26 ENCOUNTER — Encounter: Payer: BLUE CROSS/BLUE SHIELD | Admitting: Internal Medicine

## 2017-02-02 DIAGNOSIS — Z780 Asymptomatic menopausal state: Secondary | ICD-10-CM | POA: Diagnosis not present

## 2017-02-03 ENCOUNTER — Other Ambulatory Visit: Payer: Self-pay | Admitting: Internal Medicine

## 2017-02-06 ENCOUNTER — Other Ambulatory Visit: Payer: Self-pay | Admitting: Internal Medicine

## 2017-02-12 NOTE — Patient Instructions (Addendum)
Continue to see pain management physician. Appointment made for neurologist in October regarding migraine headaches. Continue current medications and return in one year or as needed. Urine culture pending.

## 2017-03-03 DIAGNOSIS — G894 Chronic pain syndrome: Secondary | ICD-10-CM | POA: Diagnosis not present

## 2017-03-03 DIAGNOSIS — M47812 Spondylosis without myelopathy or radiculopathy, cervical region: Secondary | ICD-10-CM | POA: Diagnosis not present

## 2017-03-03 DIAGNOSIS — M797 Fibromyalgia: Secondary | ICD-10-CM | POA: Diagnosis not present

## 2017-03-03 DIAGNOSIS — M47817 Spondylosis without myelopathy or radiculopathy, lumbosacral region: Secondary | ICD-10-CM | POA: Diagnosis not present

## 2017-03-22 ENCOUNTER — Ambulatory Visit: Payer: BLUE CROSS/BLUE SHIELD | Admitting: Neurology

## 2017-03-29 ENCOUNTER — Other Ambulatory Visit: Payer: Self-pay | Admitting: Internal Medicine

## 2017-03-29 DIAGNOSIS — H5203 Hypermetropia, bilateral: Secondary | ICD-10-CM | POA: Diagnosis not present

## 2017-04-13 ENCOUNTER — Ambulatory Visit: Payer: BLUE CROSS/BLUE SHIELD | Admitting: Neurology

## 2017-04-19 DIAGNOSIS — H5210 Myopia, unspecified eye: Secondary | ICD-10-CM | POA: Diagnosis not present

## 2017-04-26 DIAGNOSIS — Z79891 Long term (current) use of opiate analgesic: Secondary | ICD-10-CM | POA: Diagnosis not present

## 2017-04-26 DIAGNOSIS — M47817 Spondylosis without myelopathy or radiculopathy, lumbosacral region: Secondary | ICD-10-CM | POA: Diagnosis not present

## 2017-04-26 DIAGNOSIS — G894 Chronic pain syndrome: Secondary | ICD-10-CM | POA: Diagnosis not present

## 2017-04-26 DIAGNOSIS — M47812 Spondylosis without myelopathy or radiculopathy, cervical region: Secondary | ICD-10-CM | POA: Diagnosis not present

## 2017-04-26 DIAGNOSIS — M797 Fibromyalgia: Secondary | ICD-10-CM | POA: Diagnosis not present

## 2017-05-31 ENCOUNTER — Ambulatory Visit: Payer: BLUE CROSS/BLUE SHIELD | Admitting: Neurology

## 2017-06-17 ENCOUNTER — Other Ambulatory Visit: Payer: Self-pay | Admitting: Internal Medicine

## 2017-06-17 NOTE — Telephone Encounter (Signed)
I do not see a scheduled CPE please call and schedule before refilling

## 2017-06-22 DIAGNOSIS — G894 Chronic pain syndrome: Secondary | ICD-10-CM | POA: Diagnosis not present

## 2017-06-22 DIAGNOSIS — M47812 Spondylosis without myelopathy or radiculopathy, cervical region: Secondary | ICD-10-CM | POA: Diagnosis not present

## 2017-06-22 DIAGNOSIS — M47817 Spondylosis without myelopathy or radiculopathy, lumbosacral region: Secondary | ICD-10-CM | POA: Diagnosis not present

## 2017-06-22 DIAGNOSIS — M797 Fibromyalgia: Secondary | ICD-10-CM | POA: Diagnosis not present

## 2017-07-03 ENCOUNTER — Other Ambulatory Visit: Payer: Self-pay | Admitting: Internal Medicine

## 2017-07-03 NOTE — Telephone Encounter (Signed)
Just sent it in

## 2017-07-03 NOTE — Telephone Encounter (Signed)
Patient called to request a refill she said she's going out of town and needs it ASAP.

## 2017-07-13 ENCOUNTER — Ambulatory Visit: Payer: BLUE CROSS/BLUE SHIELD | Admitting: Internal Medicine

## 2017-07-14 ENCOUNTER — Encounter: Payer: Self-pay | Admitting: Internal Medicine

## 2017-07-14 ENCOUNTER — Ambulatory Visit: Payer: BLUE CROSS/BLUE SHIELD | Admitting: Internal Medicine

## 2017-07-14 VITALS — BP 112/80 | HR 90 | Temp 98.4°F | Wt 174.0 lb

## 2017-07-14 DIAGNOSIS — R8290 Unspecified abnormal findings in urine: Secondary | ICD-10-CM | POA: Diagnosis not present

## 2017-07-14 DIAGNOSIS — H1032 Unspecified acute conjunctivitis, left eye: Secondary | ICD-10-CM | POA: Diagnosis not present

## 2017-07-14 DIAGNOSIS — R300 Dysuria: Secondary | ICD-10-CM

## 2017-07-14 DIAGNOSIS — N390 Urinary tract infection, site not specified: Secondary | ICD-10-CM | POA: Diagnosis not present

## 2017-07-14 DIAGNOSIS — R350 Frequency of micturition: Secondary | ICD-10-CM | POA: Diagnosis not present

## 2017-07-14 LAB — POCT URINALYSIS DIPSTICK
APPEARANCE: ABNORMAL
Bilirubin, UA: NEGATIVE
Glucose, UA: NEGATIVE
Ketones, UA: NEGATIVE
NITRITE UA: NEGATIVE
Odor: ABNORMAL
PH UA: 6 (ref 5.0–8.0)
PROTEIN UA: NEGATIVE
SPEC GRAV UA: 1.015 (ref 1.010–1.025)
Urobilinogen, UA: 0.2 E.U./dL

## 2017-07-14 MED ORDER — CIPROFLOXACIN HCL 500 MG PO TABS: 500.0000 mg | ORAL_TABLET | Freq: Two times a day (BID) | ORAL | 0 refills | Status: DC

## 2017-07-14 MED ORDER — OFLOXACIN 0.3 % OP SOLN: OPHTHALMIC | 0 refills | Status: DC

## 2017-07-14 NOTE — Progress Notes (Signed)
   Subjective:    Patient ID: Tammy Farley, female    DOB: 1960-07-13, 57 y.o.   MRN: [id number]  HPI patient has had UTI symptoms for couple of weeks.  It [place] to [place] where she had vomiting and diarrhea.  Has noticed odor but no frank dysuria.  Feels that she probably has UTI.  No back pain.  No fever or shaking chills.  Has also developed apparent conjunctivitis left eye with crusting in the corners of her eyes in the morning and significant redness of the conjunctivae.    Review of Systems     Objective:   Physical Exam  Urine dipstick is abnormal.  Culture was sent.  No CVA tenderness.  She has erythema conjunctiva left eye.  Extraocular movements are full.  No obvious hordeolum      Assessment & Plan:  Acute UTI  Conjunctivitis left eye  Plan: Cipro 500 mg twice daily for 10 days.  Ocuflox ophthalmic eyedrops 2 drops in each eye 4 times a day for 5-7 days.  Warm hot compresses to left eye 20 minutes 3 times a day.

## 2017-07-14 NOTE — Patient Instructions (Signed)
Cipro 500 mg twice a day for 10 days.  Urine culture pending.  Ocuflox ophthalmic drops 2 drops 4 times a day in each eye for 5-7 days.

## 2017-07-17 LAB — URINE CULTURE
MICRO NUMBER:: 90109916
SPECIMEN QUALITY:: ADEQUATE

## 2017-07-25 ENCOUNTER — Encounter: Payer: Self-pay | Admitting: Neurology

## 2017-07-26 ENCOUNTER — Ambulatory Visit: Payer: BLUE CROSS/BLUE SHIELD | Admitting: Neurology

## 2017-08-21 ENCOUNTER — Other Ambulatory Visit: Payer: Self-pay | Admitting: Internal Medicine

## 2017-08-21 DIAGNOSIS — M797 Fibromyalgia: Secondary | ICD-10-CM | POA: Diagnosis not present

## 2017-08-21 DIAGNOSIS — M47812 Spondylosis without myelopathy or radiculopathy, cervical region: Secondary | ICD-10-CM | POA: Diagnosis not present

## 2017-08-21 DIAGNOSIS — M47817 Spondylosis without myelopathy or radiculopathy, lumbosacral region: Secondary | ICD-10-CM | POA: Diagnosis not present

## 2017-08-21 DIAGNOSIS — G894 Chronic pain syndrome: Secondary | ICD-10-CM | POA: Diagnosis not present

## 2017-09-04 ENCOUNTER — Ambulatory Visit: Payer: BLUE CROSS/BLUE SHIELD | Admitting: Internal Medicine

## 2017-09-04 ENCOUNTER — Encounter: Payer: Self-pay | Admitting: Internal Medicine

## 2017-09-04 VITALS — BP 100/80 | HR 90 | Temp 98.2°F | Ht 63.5 in | Wt 175.0 lb

## 2017-09-04 DIAGNOSIS — R350 Frequency of micturition: Secondary | ICD-10-CM | POA: Diagnosis not present

## 2017-09-04 DIAGNOSIS — N390 Urinary tract infection, site not specified: Secondary | ICD-10-CM | POA: Diagnosis not present

## 2017-09-04 DIAGNOSIS — B009 Herpesviral infection, unspecified: Secondary | ICD-10-CM | POA: Diagnosis not present

## 2017-09-04 DIAGNOSIS — R8290 Unspecified abnormal findings in urine: Secondary | ICD-10-CM | POA: Diagnosis not present

## 2017-09-04 DIAGNOSIS — T148XXA Other injury of unspecified body region, initial encounter: Secondary | ICD-10-CM

## 2017-09-04 LAB — POCT URINALYSIS DIPSTICK
BILIRUBIN UA: NEGATIVE
Glucose, UA: NEGATIVE
Ketones, UA: NEGATIVE
Nitrite, UA: POSITIVE
PH UA: 6.5 (ref 5.0–8.0)
Protein, UA: NEGATIVE
RBC UA: NEGATIVE
Spec Grav, UA: 1.015 (ref 1.010–1.025)
UROBILINOGEN UA: 0.2 U/dL

## 2017-09-04 MED ORDER — VALACYCLOVIR HCL 1 G PO TABS: 1000.0000 mg | ORAL_TABLET | Freq: Two times a day (BID) | ORAL | 0 refills | Status: DC

## 2017-09-04 MED ORDER — CIPROFLOXACIN HCL 500 MG PO TABS
500.0000 mg | ORAL_TABLET | Freq: Two times a day (BID) | ORAL | 0 refills | Status: DC
Start: 2017-09-04 — End: 2017-09-18

## 2017-09-04 NOTE — Progress Notes (Signed)
   Subjective:    Patient ID: Tammy Farley, female    DOB: [birth year], 57 y.o.   MRN: [id number]  HPI Patient was seen January 25 for urinary tract infection that have been present for a couple of weeks.  Was treated with a 10-day course of Cipro.  Culture grew E. coli which was sensitive to Cipro.  Now thinks she has another UTI and urine specimen is abnormal.  Culture was sent and she was restarted on Cipro.  Patient says husband has history of Herpes simplex type II.  They have been married 10 years.  She is never had an issue with it but developed a rash top of right buttock medial aspect while at the beach several days ago.  It is beginning to heal.  Says it felt a little irritated at the time.  No genital lesions.    Review of Systems see above     Objective:   Physical Exam She has an area approximately 1 cm in diameter with several distinct red dots that could have been vesicular lesions several days ago.  Attempted to take culture for HSV testing.  There is no vaginal discharge and no evidence of any other genital lesions.       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  Valtrex 1 g twice daily for 10 days.  Culture is pending.  25 minutes spent with patient

## 2017-09-04 NOTE — Patient Instructions (Signed)
Cipro 500 mg twice daily for 10 days for acute UTI.  Culture is pending.  Herpes culture pending but treated with Valtrex 1 g twice daily for 10 days.

## 2017-09-05 LAB — URINE CULTURE
MICRO NUMBER:: 90338507
SPECIMEN QUALITY:: ADEQUATE

## 2017-09-06 LAB — HERPES SIMPLEX VIRUS CULTURE
MICRO NUMBER:: 90338510
SPECIMEN QUALITY: ADEQUATE

## 2017-09-18 ENCOUNTER — Encounter: Payer: Self-pay | Admitting: Neurology

## 2017-09-18 ENCOUNTER — Ambulatory Visit: Payer: BLUE CROSS/BLUE SHIELD | Admitting: Neurology

## 2017-09-18 ENCOUNTER — Telehealth: Payer: Self-pay | Admitting: Neurology

## 2017-09-18 VITALS — BP 116/79 | HR 84 | Ht 63.0 in | Wt 181.0 lb

## 2017-09-18 DIAGNOSIS — G43711 Chronic migraine without aura, intractable, with status migrainosus: Secondary | ICD-10-CM

## 2017-09-18 NOTE — Patient Instructions (Signed)
Botox for migraine Ajovy for migraine   Fremanezumab: Drug information [employer] here. Copyright [id number] [employer]. All rights reserved. (For additional information see "Fremanezumab: Patient drug information")  For abbreviations and symbols that may be used in [employer] (show table) Brand Names: US  Ajovy  Pharmacologic Category  Calcitonin Gene-Related Peptide (CGRP) Receptor Antagonist;  Monoclonal Antibody, Calcitonin Gene-Related Peptide (CGRP) Receptor Antagonist  Dosing: Adult Migraine prophylaxis: SubQ: 225 mg monthly or 675 mg every 3 months. When switching dosage options, administer the first dose of the new regimen on the next scheduled date of administration. Dosing: Renal Impairment: Adult There are no dosage adjustments provided in the manufacturer's labeling. Renal impairment is not expected to affect the pharmacokinetics of fremanezumab-vfrm. Dosing: Hepatic Impairment: Adult There are no dosage adjustments provided in the manufacturer's labeling. Hepatic impairment is not expected to affect the pharmacokinetics of fremanezumab-vfrm. Dosing: Geriatric Refer to adult dosing. Dosage Forms: US Excipient information presented when available (limited, particularly for generics); consult specific product labeling. Solution Prefilled Syringe, Subcutaneous [preservative free]:  Ajovy: 225 mg/1.5 mL (1.5 mL) [contains edetate disodium dihydrate, polysorbate 80] Generic Equivalent Available: US No Administration: Adult SubQ: For subcutaneous use only. Keep out of direct sunlight and allow prefilled syringe to come to room temperature for 30 minutes before administration. Do not warm by using a heat source (eg, hot water, microwave). Do not shake. Administer in the abdomen, thigh, or upper arm, avoiding areas that are tender, bruised, red, or indurated. The 675 mg dose should be administered as 3 consecutive 225 mg injections. For multiple injections, use the  same body site, but not the exact location of the previous injection. Use: Labeled Indications Migraine prophylaxis: Preventive treatment of migraine in adults Adverse Reactions >10%: Local: Injection site reaction (43% to 45%) 1% to 10%: Immunologic: Antibody development (?2%; neutralizing <1%) Frequency not defined: Hypersensitivity: Hypersensitivity reaction Contraindications Serious hypersensitivity to fremanezumab-vfrm or any component of the formulation Warnings/Precautions Concerns related to adverse effects: Marland Kitchen. Hypersensitivity: Hypersensitivity reactions, including rash, pruritus, drug hypersensitivity, and urticaria, have been reported. Most reactions were mild to moderate and were reported from within hours to 1 month after administration. If a hypersensitivity reaction occurs, consider discontinuing treatment and institute appropriate therapy. Disease-related concerns: . Cardiovascular disease: Patients with a history of significant cardiovascular disease, vascular ischemia, or thrombotic events, such as cerebrovascular accident, transient ischemic attacks, deep vein thrombosis, or pulmonary embolism were excluded from clinical trials; use with caution in these patients. Dosage form specific issues:  . Polysorbate 80: Some dosage forms may contain polysorbate 80 (also known as Tweens). Hypersensitivity reactions, usually a delayed reaction, have been reported following exposure to pharmaceutical products containing polysorbate 80 in certain individuals Maureen Ralphs(Isaksson 2002; Lucente 2000; Vance GatherShelley 1995). Thrombocytopenia, ascites, pulmonary deterioration, and renal and hepatic failure have been reported in premature neonates after receiving parenteral products containing polysorbate 80 (Alade 1986; CDC 1984). See manufacturer's labeling. Other warnings/precautions:  . Immunogenicity: Anti-fremanezumab-vfrm antibodies and neutralizing antibodies may develop. Metabolism/Transport Effects None  known. Drug Interactions   (For additional information: Launch drug interactions program)   There are no known significant interactions. Pregnancy Implications Adverse events were not observed in animal reproduction studies. Fremanezumab-vfrm is a monoclonal antibody; consider the long half-life prior to use in females who are or may become pregnant until information related to pregnancy is available (Tepper 2018). Breast-Feeding Considerations It is not known if fremanezumab-vfrm is present in breast milk. According to the manufacturer, the decision to breastfeed during therapy should consider the  risk of infant exposure, the benefits of breastfeeding to the infant, and benefits of treatment to the mother. Monitoring Parameters Number of monthly migraine days. Mechanism of Action Fremanezumab-vfrm is a human monoclonal antibody that antagonizes calcitonin gene-related peptide (CGRP) receptor function. Pharmacodynamics and Pharmacokinetics Distribution: Vd: ~6 L Metabolism: Degraded by enzymatic proteolysis into small peptides and amino acids. Half-life elimination: ~31 days Time to peak: 5 to 7 days Excretion: Clearance: 0.141 L/day Pricing: [place] Solution Prefilled Syringe (Ajovy Subcutaneous) 225 mg/1.5 mL (per mL): $460.00 Disclaimer: A representative AWP (Average Wholesale Price) price or price range is provided as reference price only. A range is provided when more than one manufacturer's AWP price is available and uses the low and high price reported by the manufacturers to determine the range. The pricing data should be used for benchmarking purposes only, and as such should not be used alone to set or adjudicate any prices for reimbursement or purchasing functions or considered to be an exact price for a single product and/or manufacturer. [employer] expressly disclaims all warranties of any kind or nature, whether express or implied, and assumes no liability with respect to accuracy  of price or price range data published in its solutions. In no event shall [employer] be liable for special, indirect, incidental, or consequential damages arising from use of price or price range data. Pricing data is updated monthly.
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
## 2017-09-18 NOTE — Telephone Encounter (Signed)
New patient submission for botox. Patient has been scheduled. Patients authorizations papers and pharmacy enrollment given to Dr. Lucia GaskinsAhern for signature.

## 2017-09-19 DIAGNOSIS — G43711 Chronic migraine without aura, intractable, with status migrainosus: Secondary | ICD-10-CM | POA: Insufficient documentation

## 2017-09-19 NOTE — Telephone Encounter (Signed)
Tammy Farley called from [facility] and requested to know which code would be billed with [facility]. I informed her it would be [id number].

## 2017-09-22 ENCOUNTER — Other Ambulatory Visit: Payer: Self-pay | Admitting: Internal Medicine

## 2017-09-26 NOTE — Telephone Encounter (Signed)
I called to check status of the patients botox medication at [facility] [phone]. It is still pending insurance verification and processing. They have it marked as stat.

## 2017-09-27 ENCOUNTER — Other Ambulatory Visit: Payer: Self-pay | Admitting: Internal Medicine

## 2017-09-28 NOTE — Telephone Encounter (Signed)
I called to check status of the patients medication. It is still pending insurance verification.

## 2017-10-02 NOTE — Telephone Encounter (Signed)
I called and spoke with a rep from insurance who said she would call and get benefits [facility] if I was willing to hold. I agreed. We held for an hour and 27 minutes and [facility] told us there was a system error and we would have to call back.

## 2017-10-02 NOTE — Telephone Encounter (Signed)
I called [facility] to check status of the medication. I was transferred to a representative in insurance. She said it was still pending and that they dont even have any insurance information for the patient or a PA. I gave her the PA information [phone] [id number] (09/19/17-03/18/18).  I informed her that an insurance card was faxed over with the script and gave her the information for the patients insurance again.

## 2017-10-03 NOTE — Telephone Encounter (Signed)
I called to check the status of the medication. I spoke with Natala in the insurance department who said it was still pending verification. I informed her the patients apt was tomorrow and she said she would send an email to a supervisor.

## 2017-10-04 ENCOUNTER — Ambulatory Visit (INDEPENDENT_AMBULATORY_CARE_PROVIDER_SITE_OTHER): Payer: BLUE CROSS/BLUE SHIELD | Admitting: Neurology

## 2017-10-04 ENCOUNTER — Encounter: Payer: Self-pay | Admitting: Neurology

## 2017-10-04 ENCOUNTER — Telehealth: Payer: Self-pay | Admitting: Neurology

## 2017-10-04 VITALS — BP 120/80 | HR 88

## 2017-10-04 DIAGNOSIS — G43711 Chronic migraine without aura, intractable, with status migrainosus: Secondary | ICD-10-CM | POA: Diagnosis not present

## 2017-10-04 NOTE — Progress Notes (Signed)

## 2017-10-04 NOTE — Progress Notes (Signed)
Botox- 100 units x 2 vials Lot: [id number] Expiration: 02/2020 NDC: [id number]  Bacteriostatic 0.9% Sodium Chloride- 4mL total Lot: [id number] Expiration: 10/19/2018 NDC: [id number]  Dx: I69.629G43.711 B/B //BCrn

## 2017-10-04 NOTE — Telephone Encounter (Signed)
12 week botox

## 2017-10-11 NOTE — Telephone Encounter (Signed)
I called patient to schedule her next injection. She did not answer so I left a VM asking her to call me back.

## 2017-10-12 NOTE — Telephone Encounter (Addendum)
Spoke with patient and discussed that RN spoke with work-in MD due to Dr. Lucia Farley not available this week. He has suggested to give the Botox some time before we start another treatment that way we can see which one brings improvement. Suggested pt to please call back next week which should be about the time she should see if it is working or not. Her botox was on 4/17. Patient verbalized understanding and appreciation and she will do that. She also stated that she has a headache that started yesterday and not a migraine. She is trying to avoid OTC medications. RN advised pt can take these as long as she has no allergies or contrindications but we agree to avoid them, taking no more than about 1-2 times per week because this can cause rebound headaches. Patient verbalized appreciation and understanding and will call back if this does not go away.   Dr. Pearlean Farley updated after call to patient.

## 2017-10-12 NOTE — Telephone Encounter (Signed)
Spoke with Dr. Pearlean BrownieSethi, work-in doctor. He advises at this time to give the Botox some time to see if it brings improvement and then can reevaluate next week for possible addition of Ajovy when Dr. Lucia GaskinsAhern is back in office.

## 2017-10-12 NOTE — Telephone Encounter (Signed)
I scheduled the patient. She requested to know about the medication that was being called in (she believes it was ajovy) She has not heard anything about it. Please call and advise.

## 2017-10-18 DIAGNOSIS — M797 Fibromyalgia: Secondary | ICD-10-CM | POA: Diagnosis not present

## 2017-10-18 DIAGNOSIS — G894 Chronic pain syndrome: Secondary | ICD-10-CM | POA: Diagnosis not present

## 2017-10-18 DIAGNOSIS — M47812 Spondylosis without myelopathy or radiculopathy, cervical region: Secondary | ICD-10-CM | POA: Diagnosis not present

## 2017-10-18 DIAGNOSIS — M47817 Spondylosis without myelopathy or radiculopathy, lumbosacral region: Secondary | ICD-10-CM | POA: Diagnosis not present

## 2017-11-16 DIAGNOSIS — G894 Chronic pain syndrome: Secondary | ICD-10-CM | POA: Diagnosis not present

## 2017-11-16 DIAGNOSIS — M797 Fibromyalgia: Secondary | ICD-10-CM | POA: Diagnosis not present

## 2017-11-16 DIAGNOSIS — M47817 Spondylosis without myelopathy or radiculopathy, lumbosacral region: Secondary | ICD-10-CM | POA: Diagnosis not present

## 2017-11-16 DIAGNOSIS — M47812 Spondylosis without myelopathy or radiculopathy, cervical region: Secondary | ICD-10-CM | POA: Diagnosis not present

## 2017-11-20 ENCOUNTER — Telehealth: Payer: Self-pay | Admitting: Internal Medicine

## 2017-11-20 NOTE — Telephone Encounter (Signed)
Has another sty in her eye (right).  Last time it was in her left eye.) She has been putting hot compresses on it.  However, it has not gone away.  She is leaving on Wednesday to go out of the country.  She is wanting to know if you will call the eye drop for her that you called in previously?  She is afraid to leave the country without something with her to help in the event that she needs them.   Pharmacy:  [facility] [place] [facility]  Phone # [phone]  Thank you.

## 2017-11-20 NOTE — Telephone Encounter (Signed)
Call in Ocuflox ophthalmic drops 2 drops in eye 4 times a day x 5-7 days.

## 2017-11-21 DIAGNOSIS — H00019 Hordeolum externum unspecified eye, unspecified eyelid: Secondary | ICD-10-CM

## 2017-11-21 MED ORDER — OFLOXACIN 0.3 % OP SOLN: 2.0000 [drp] | Freq: Four times a day (QID) | OPHTHALMIC | 0 refills | Status: AC

## 2017-11-21 NOTE — Telephone Encounter (Signed)
rx e-scribed, pt was notified.

## 2017-12-25 ENCOUNTER — Telehealth: Payer: Self-pay | Admitting: Neurology

## 2017-12-25 NOTE — Telephone Encounter (Signed)
I called [facility] and spoke with Asher MuirJamie who initiated a new refill request for the patient's Botox. We scheduled the delivery for July 18th.

## 2017-12-27 ENCOUNTER — Other Ambulatory Visit: Payer: Self-pay | Admitting: Internal Medicine

## 2018-01-03 ENCOUNTER — Other Ambulatory Visit: Payer: Self-pay | Admitting: Internal Medicine

## 2018-01-08 NOTE — Telephone Encounter (Signed)
I called the pharmacy and spoke with a representative who said the patient insurance was still pending the medication payment.

## 2018-01-09 ENCOUNTER — Ambulatory Visit: Payer: BLUE CROSS/BLUE SHIELD | Admitting: Neurology

## 2018-01-09 DIAGNOSIS — Z1231 Encounter for screening mammogram for malignant neoplasm of breast: Secondary | ICD-10-CM | POA: Diagnosis not present

## 2018-01-09 NOTE — Telephone Encounter (Signed)
I called to check status of the patients medication. I spoke with Tammy Farley in insurance department. She stated that it was still pending, I informed her that it had been pending for over 2 weeks. She said they are behind right now, but it has been marked stat.

## 2018-01-10 NOTE — Telephone Encounter (Signed)
I spoke to [name] with [employer] she informed me they have to change it to a 30 day prescription Tammy Farley stated she is going to work on this and open it up as a stat process. They are aware of her appointment being Monday 01/15/18.

## 2018-01-10 NOTE — Telephone Encounter (Signed)
Noted, thank you

## 2018-01-10 NOTE — Telephone Encounter (Signed)
[employer] authorizations  [id number] (03/18/18). DW

## 2018-01-11 NOTE — Telephone Encounter (Signed)
I called to check status of the patients medication. I spoke with Tammy Farley we scheduled delivery for tomorrow 07/26. DW

## 2018-01-12 NOTE — Telephone Encounter (Signed)
Medication did not arrive today as scheduled. I called to check status and the pharmacy I spoke with Michel SanteeElie who told me they did not ship it out because they had not completed insurance verification. They are sending an email to the major medical team and will make an outreach to me after they have completed it.

## 2018-01-12 NOTE — Telephone Encounter (Signed)
That's fine thank you

## 2018-01-12 NOTE — Telephone Encounter (Signed)
I spoke with the patient who was agreeable to changing to b/b and bill for this apt if it is ok with Dr. Lucia Tammy Farley. Dr. Lucia Tammy Farley we have already rescheduled her once, are you ok with switching her to B/B for Mondays apt?

## 2018-01-15 ENCOUNTER — Telehealth: Payer: Self-pay | Admitting: Neurology

## 2018-01-15 ENCOUNTER — Encounter: Payer: Self-pay | Admitting: Internal Medicine

## 2018-01-15 ENCOUNTER — Ambulatory Visit (INDEPENDENT_AMBULATORY_CARE_PROVIDER_SITE_OTHER): Payer: BLUE CROSS/BLUE SHIELD | Admitting: Neurology

## 2018-01-15 DIAGNOSIS — G43711 Chronic migraine without aura, intractable, with status migrainosus: Secondary | ICD-10-CM

## 2018-01-15 NOTE — Telephone Encounter (Signed)
12 week botox

## 2018-01-15 NOTE — Progress Notes (Signed)
Consent Form Botulism Toxin Injection For Chronic Migraine  Interval history 01/15/2018:  Exceptional response, > 90% decrease in frequency and severity. Ask about masseters next time, did not do that this time. DId not do eyes, or temples or LS. She may want masseters next time though. At baseline she has Daily headaches, 12 are migrainous.  Reviewed orally with patient, additionally signature is on file:  Botulism toxin has been approved by the Federal drug administration for treatment of chronic migraine. Botulism toxin does not cure chronic migraine and it may not be effective in some patients.  The administration of botulism toxin is accomplished by injecting a small amount of toxin into the muscles of the neck and head. Dosage must be titrated for each individual. Any benefits resulting from botulism toxin tend to wear off after 3 months with a repeat injection required if benefit is to be maintained. Injections are usually done every 3-4 months with maximum effect peak achieved by about 2 or 3 weeks. Botulism toxin is expensive and you should be sure of what costs you will incur resulting from the injection.  The side effects of botulism toxin use for chronic migraine may include:   -Transient, and usually mild, facial weakness with facial injections  -Transient, and usually mild, head or neck weakness with head/neck injections  -Reduction or loss of forehead facial animation due to forehead muscle weakness  -Eyelid drooping  -Dry eye  -Pain at the site of injection or bruising at the site of injection  -Double vision  -Potential unknown long term risks  Contraindications: You should not have Botox if you are pregnant, nursing, allergic to albumin, have an infection, skin condition, or muscle weakness at the site of the injection, or have myasthenia gravis, Lambert-Eaton syndrome, or ALS.  It is also possible that as with any injection, there may be an allergic reaction or no effect  from the medication. Reduced effectiveness after repeated injections is sometimes seen and rarely infection at the injection site may occur. All care will be taken to prevent these side effects. If therapy is given over a long time, atrophy and wasting in the muscle injected may occur. Occasionally the patient's become refractory to treatment because they develop antibodies to the toxin. In this event, therapy needs to be modified.  I have read the above information and consent to the administration of botulism toxin.    BOTOX PROCEDURE NOTE FOR MIGRAINE HEADACHE    Contraindications and precautions discussed with patient(above). Aseptic procedure was observed and patient tolerated procedure. Procedure performed by Dr. Artemio Aly  The condition has existed for more than 6 months, and pt does not have a diagnosis of ALS, Myasthenia Gravis or Lambert-Eaton Syndrome.  Risks and benefits of injections discussed and pt agrees to proceed with the procedure.  Written consent obtained  These injections are medically necessary. Pt  receives good benefits from these injections. These injections do not cause sedations or hallucinations which the oral therapies may cause.  Indication/Diagnosis: chronic migraine BOTOX(J0585) injection was performed according to protocol by [employer]. 200 units of BOTOX was dissolved into 4 cc NS.   NDC: [address]-95   Description of procedure:  The patient was placed in a sitting position. The standard protocol was used for Botox as follows, with 5 units of Botox injected at each site:   -Procerus muscle, midline injection  -Corrugator muscle, bilateral injection  -Frontalis muscle, bilateral injection, with 2 sites each side, medial injection was performed in the upper  one third of the frontalis muscle, in the region vertical from the medial inferior edge of the superior orbital rim. The lateral injection was again in the upper one third of the forehead vertically  above the lateral limbus of the cornea, 1.5 cm lateral to the medial injection site.  -Temporalis muscle injection, 4 sites, bilaterally. The first injection was 3 cm above the tragus of the ear, second injection site was 1.5 cm to 3 cm up from the first injection site in line with the tragus of the ear. The third injection site was 1.5-3 cm forward between the first 2 injection sites. The fourth injection site was 1.5 cm posterior to the second injection site.  -Occipitalis muscle injection, 3 sites, bilaterally. The first injection was done one half way between the occipital protuberance and the tip of the mastoid process behind the ear. The second injection site was done lateral and superior to the first, 1 fingerbreadth from the first injection. The third injection site was 1 fingerbreadth superiorly and medially from the first injection site.  -Cervical paraspinal muscle injection, 2 sites, bilateral knee first injection site was 1 cm from the midline of the cervical spine, 3 cm inferior to the lower border of the occipital protuberance. The second injection site was 1.5 cm superiorly and laterally to the first injection site.  -Trapezius muscle injection was performed at 3 sites, bilaterally. The first injection site was in the upper trapezius muscle halfway between the inflection point of the neck, and the acromion. The second injection site was one half way between the acromion and the first injection site. The third injection was done between the first injection site and the inflection point of the neck.   Will return for repeat injection in 3 months.   A 200 unit sof Botox was used, 155 units were injected, the rest of the Botox was wasted. The patient tolerated the procedure well, there were no complications of the above procedure.

## 2018-01-15 NOTE — Progress Notes (Signed)
Botox- 100 units x 2 vials Lot: [id number] Expiration: 07/2020 NDC: 0023-1145-01  Bacteriostatic 0.9% Sodium Chloride- 4mL total Lot: [id number] Expiration: 10/19/2018 NDC: 0409-1966-02  Dx: G43.711 B/B   

## 2018-01-16 DIAGNOSIS — G894 Chronic pain syndrome: Secondary | ICD-10-CM | POA: Diagnosis not present

## 2018-01-16 DIAGNOSIS — M797 Fibromyalgia: Secondary | ICD-10-CM | POA: Diagnosis not present

## 2018-01-16 DIAGNOSIS — M47817 Spondylosis without myelopathy or radiculopathy, lumbosacral region: Secondary | ICD-10-CM | POA: Diagnosis not present

## 2018-01-16 DIAGNOSIS — M47812 Spondylosis without myelopathy or radiculopathy, cervical region: Secondary | ICD-10-CM | POA: Diagnosis not present

## 2018-01-16 NOTE — Telephone Encounter (Signed)
I called and scheduled the patient. DW  °

## 2018-01-18 NOTE — Telephone Encounter (Signed)
Pt requesting a call stating that the specialty pharmacy called asking her a bunch of different questions stating it left her confused. Please call to advise

## 2018-01-18 NOTE — Telephone Encounter (Signed)
I returned the patients call and spoke with her regarding the pharmacy. The pharmacy was calling to do enrollment.

## 2018-01-22 ENCOUNTER — Other Ambulatory Visit: Payer: Self-pay | Admitting: Internal Medicine

## 2018-01-22 DIAGNOSIS — Z820 Family history of epilepsy and other diseases of the nervous system: Secondary | ICD-10-CM

## 2018-01-22 DIAGNOSIS — G8929 Other chronic pain: Secondary | ICD-10-CM

## 2018-01-22 DIAGNOSIS — Z0000 Encounter for general adult medical examination without abnormal findings: Secondary | ICD-10-CM

## 2018-01-22 DIAGNOSIS — Z8719 Personal history of other diseases of the digestive system: Secondary | ICD-10-CM

## 2018-01-22 DIAGNOSIS — M5442 Lumbago with sciatica, left side: Secondary | ICD-10-CM

## 2018-01-22 DIAGNOSIS — M797 Fibromyalgia: Secondary | ICD-10-CM

## 2018-01-22 DIAGNOSIS — Z1329 Encounter for screening for other suspected endocrine disorder: Secondary | ICD-10-CM

## 2018-01-22 DIAGNOSIS — E7849 Other hyperlipidemia: Secondary | ICD-10-CM

## 2018-01-22 DIAGNOSIS — Z1321 Encounter for screening for nutritional disorder: Secondary | ICD-10-CM

## 2018-01-23 ENCOUNTER — Other Ambulatory Visit: Payer: BLUE CROSS/BLUE SHIELD | Admitting: Internal Medicine

## 2018-01-23 DIAGNOSIS — Z0000 Encounter for general adult medical examination without abnormal findings: Secondary | ICD-10-CM

## 2018-01-23 DIAGNOSIS — E7849 Other hyperlipidemia: Secondary | ICD-10-CM

## 2018-01-23 DIAGNOSIS — M797 Fibromyalgia: Secondary | ICD-10-CM | POA: Diagnosis not present

## 2018-01-23 DIAGNOSIS — G8929 Other chronic pain: Secondary | ICD-10-CM | POA: Diagnosis not present

## 2018-01-23 DIAGNOSIS — Z1329 Encounter for screening for other suspected endocrine disorder: Secondary | ICD-10-CM

## 2018-01-23 DIAGNOSIS — N390 Urinary tract infection, site not specified: Secondary | ICD-10-CM | POA: Diagnosis not present

## 2018-01-23 DIAGNOSIS — Z820 Family history of epilepsy and other diseases of the nervous system: Secondary | ICD-10-CM

## 2018-01-23 DIAGNOSIS — Z8719 Personal history of other diseases of the digestive system: Secondary | ICD-10-CM

## 2018-01-23 DIAGNOSIS — M5442 Lumbago with sciatica, left side: Secondary | ICD-10-CM

## 2018-01-23 DIAGNOSIS — K5792 Diverticulitis of intestine, part unspecified, without perforation or abscess without bleeding: Secondary | ICD-10-CM | POA: Diagnosis not present

## 2018-01-23 DIAGNOSIS — R4589 Other symptoms and signs involving emotional state: Secondary | ICD-10-CM | POA: Diagnosis not present

## 2018-01-23 DIAGNOSIS — R8290 Unspecified abnormal findings in urine: Secondary | ICD-10-CM | POA: Diagnosis not present

## 2018-01-23 DIAGNOSIS — Z1321 Encounter for screening for nutritional disorder: Secondary | ICD-10-CM

## 2018-01-24 LAB — VITAMIN D 25 HYDROXY (VIT D DEFICIENCY, FRACTURES): Vit D, 25-Hydroxy: 36 ng/mL (ref 30–100)

## 2018-01-24 LAB — CBC WITH DIFFERENTIAL/PLATELET
Basophils Absolute: 39 cells/uL (ref 0–200)
Basophils Relative: 0.7 %
EOS PCT: 1.8 %
Eosinophils Absolute: 99 cells/uL (ref 15–500)
HEMATOCRIT: 38.4 % (ref 35.0–45.0)
Hemoglobin: 13 g/dL (ref 11.7–15.5)
LYMPHS ABS: 2162 {cells}/uL (ref 850–3900)
MCH: 29.3 pg (ref 27.0–33.0)
MCHC: 33.9 g/dL (ref 32.0–36.0)
MCV: 86.5 fL (ref 80.0–100.0)
MPV: 10.3 fL (ref 7.5–12.5)
Monocytes Relative: 7.8 %
NEUTROS PCT: 50.4 %
Neutro Abs: 2772 cells/uL (ref 1500–7800)
Platelets: 237 10*3/uL (ref 140–400)
RBC: 4.44 10*6/uL (ref 3.80–5.10)
RDW: 12.4 % (ref 11.0–15.0)
Total Lymphocyte: 39.3 %
WBC mixed population: 429 cells/uL (ref 200–950)
WBC: 5.5 10*3/uL (ref 3.8–10.8)

## 2018-01-24 LAB — COMPLETE METABOLIC PANEL WITH GFR
AG Ratio: 2 (calc) (ref 1.0–2.5)
ALBUMIN MSPROF: 4.5 g/dL (ref 3.6–5.1)
ALT: 17 U/L (ref 6–29)
AST: 16 U/L (ref 10–35)
Alkaline phosphatase (APISO): 52 U/L (ref 33–130)
BILIRUBIN TOTAL: 0.4 mg/dL (ref 0.2–1.2)
BUN: 10 mg/dL (ref 7–25)
CHLORIDE: 108 mmol/L (ref 98–110)
CO2: 28 mmol/L (ref 20–32)
Calcium: 10 mg/dL (ref 8.6–10.4)
Creat: 0.7 mg/dL (ref 0.50–1.05)
GFR, Est African American: 111 mL/min/{1.73_m2} (ref >=60)
GFR, Est Non African American: 96 mL/min/{1.73_m2} (ref >=60)
GLOBULIN: 2.3 g/dL (ref 1.9–3.7)
GLUCOSE: 88 mg/dL (ref 65–99)
Potassium: 5.1 mmol/L (ref 3.5–5.3)
SODIUM: 144 mmol/L (ref 135–146)
TOTAL PROTEIN: 6.8 g/dL (ref 6.1–8.1)

## 2018-01-24 LAB — TSH: TSH: 2.01 m[IU]/L (ref 0.40–4.50)

## 2018-01-24 LAB — LIPID PANEL
CHOLESTEROL: 172 mg/dL (ref <200)
HDL: 54 mg/dL (ref >50)
LDL CHOLESTEROL (CALC): 91 mg/dL
Non-HDL Cholesterol (Calc): 118 mg/dL (calc) (ref <130)
TRIGLYCERIDES: 163 mg/dL — AB (ref <150)
Total CHOL/HDL Ratio: 3.2 (calc) (ref <5.0)

## 2018-01-25 ENCOUNTER — Other Ambulatory Visit: Payer: BLUE CROSS/BLUE SHIELD | Admitting: Internal Medicine

## 2018-01-26 ENCOUNTER — Other Ambulatory Visit: Payer: BLUE CROSS/BLUE SHIELD | Admitting: Internal Medicine

## 2018-01-30 ENCOUNTER — Ambulatory Visit (INDEPENDENT_AMBULATORY_CARE_PROVIDER_SITE_OTHER): Payer: BLUE CROSS/BLUE SHIELD | Admitting: Internal Medicine

## 2018-01-30 ENCOUNTER — Encounter: Payer: Self-pay | Admitting: Internal Medicine

## 2018-01-30 VITALS — BP 122/90 | HR 90 | Ht 63.0 in | Wt 178.0 lb

## 2018-01-30 DIAGNOSIS — Z0000 Encounter for general adult medical examination without abnormal findings: Secondary | ICD-10-CM

## 2018-01-30 DIAGNOSIS — R8290 Unspecified abnormal findings in urine: Secondary | ICD-10-CM | POA: Diagnosis not present

## 2018-01-30 DIAGNOSIS — K5909 Other constipation: Secondary | ICD-10-CM

## 2018-01-30 DIAGNOSIS — R4589 Other symptoms and signs involving emotional state: Secondary | ICD-10-CM

## 2018-01-30 DIAGNOSIS — K5792 Diverticulitis of intestine, part unspecified, without perforation or abscess without bleeding: Secondary | ICD-10-CM | POA: Diagnosis not present

## 2018-01-30 DIAGNOSIS — Z8669 Personal history of other diseases of the nervous system and sense organs: Secondary | ICD-10-CM

## 2018-01-30 DIAGNOSIS — E7849 Other hyperlipidemia: Secondary | ICD-10-CM

## 2018-01-30 DIAGNOSIS — M797 Fibromyalgia: Secondary | ICD-10-CM | POA: Diagnosis not present

## 2018-01-30 DIAGNOSIS — N390 Urinary tract infection, site not specified: Secondary | ICD-10-CM | POA: Diagnosis not present

## 2018-01-30 DIAGNOSIS — Z8719 Personal history of other diseases of the digestive system: Secondary | ICD-10-CM

## 2018-01-30 LAB — POCT URINALYSIS DIPSTICK
BILIRUBIN UA: NEGATIVE
Glucose, UA: NEGATIVE
KETONES UA: NEGATIVE
NITRITE UA: POSITIVE
PH UA: 6.5 (ref 5.0–8.0)
PROTEIN UA: NEGATIVE
SPEC GRAV UA: 1.01 (ref 1.010–1.025)
UROBILINOGEN UA: 0.2 U/dL

## 2018-01-30 MED ORDER — FLUOXETINE HCL 10 MG PO CAPS: 10.0000 mg | ORAL_CAPSULE | Freq: Every day | ORAL | 3 refills | Status: AC

## 2018-01-30 NOTE — Patient Instructions (Signed)
Add Prozac 10 mg daily to Wellbutrin for tearfulness. Watch diet and exercise. Urine culture pending
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
## 2018-02-01 ENCOUNTER — Other Ambulatory Visit: Payer: Self-pay | Admitting: Internal Medicine

## 2018-02-01 LAB — URINE CULTURE
MICRO NUMBER: 90959022
SPECIMEN QUALITY:: ADEQUATE

## 2018-02-01 MED ORDER — AMOXICILLIN-POT CLAVULANATE 500-125 MG PO TABS: 1.0000 | ORAL_TABLET | Freq: Three times a day (TID) | ORAL | 0 refills | Status: DC

## 2018-02-05 ENCOUNTER — Telehealth: Payer: Self-pay | Admitting: Neurology

## 2018-02-05 NOTE — Telephone Encounter (Signed)
[facility] wanting to schedule delivery of pt's Botox please call [phone]

## 2018-02-12 ENCOUNTER — Ambulatory Visit (INDEPENDENT_AMBULATORY_CARE_PROVIDER_SITE_OTHER): Payer: BLUE CROSS/BLUE SHIELD | Admitting: Internal Medicine

## 2018-02-12 DIAGNOSIS — R8290 Unspecified abnormal findings in urine: Secondary | ICD-10-CM

## 2018-02-12 LAB — POCT URINALYSIS DIPSTICK
Appearance: NORMAL
Bilirubin, UA: NEGATIVE
Glucose, UA: NEGATIVE
Ketones, UA: NEGATIVE
LEUKOCYTES UA: NEGATIVE
NITRITE UA: NEGATIVE
Odor: NORMAL
PH UA: 6 (ref 5.0–8.0)
PROTEIN UA: NEGATIVE
RBC UA: NEGATIVE
SPEC GRAV UA: 1.01 (ref 1.010–1.025)
Urobilinogen, UA: 0.2 E.U./dL

## 2018-02-12 NOTE — Telephone Encounter (Signed)
I have already spoken to them regarding this, please disregard.

## 2018-03-01 ENCOUNTER — Other Ambulatory Visit: Payer: Self-pay | Admitting: Internal Medicine

## 2018-03-15 DIAGNOSIS — G894 Chronic pain syndrome: Secondary | ICD-10-CM | POA: Diagnosis not present

## 2018-03-15 DIAGNOSIS — M47812 Spondylosis without myelopathy or radiculopathy, cervical region: Secondary | ICD-10-CM | POA: Diagnosis not present

## 2018-03-15 DIAGNOSIS — M797 Fibromyalgia: Secondary | ICD-10-CM | POA: Diagnosis not present

## 2018-03-15 DIAGNOSIS — M47817 Spondylosis without myelopathy or radiculopathy, lumbosacral region: Secondary | ICD-10-CM | POA: Diagnosis not present

## 2018-03-28 ENCOUNTER — Other Ambulatory Visit: Payer: Self-pay | Admitting: Internal Medicine

## 2018-04-17 ENCOUNTER — Telehealth: Payer: Self-pay | Admitting: Neurology

## 2018-04-17 NOTE — Telephone Encounter (Signed)
I called to request a refill for the patient's Botox. DW

## 2018-04-18 ENCOUNTER — Ambulatory Visit: Payer: BLUE CROSS/BLUE SHIELD | Admitting: Neurology

## 2018-04-23 NOTE — Telephone Encounter (Signed)
I called to check the status of the patients medication. They did not start the request on the 29th when I called. They are sending the account to a Production designer, theatre/television/film.

## 2018-04-23 NOTE — Telephone Encounter (Signed)
Botox authorization [id number] (04/17/19). DW

## 2018-04-24 ENCOUNTER — Ambulatory Visit (INDEPENDENT_AMBULATORY_CARE_PROVIDER_SITE_OTHER): Payer: BLUE CROSS/BLUE SHIELD | Admitting: Neurology

## 2018-04-24 DIAGNOSIS — G43711 Chronic migraine without aura, intractable, with status migrainosus: Secondary | ICD-10-CM

## 2018-04-24 NOTE — Progress Notes (Signed)
Botox- 100 units x 2 vials Lot: [id number] Expiration: 09/2020 NDC: 0023-1145-01  Bacteriostatic 0.9% Sodium Chloride- 4mL total Lot: [id number] Expiration: 03/21/2019 NDC: 0409-1966-02  Dx: G43.711 B/B   

## 2018-04-24 NOTE — Progress Notes (Signed)
 Consent Form Botulism Toxin Injection For Chronic Migraine  Interval history 01/15/2018:  Exceptional response, > 90% decrease in frequency and severity. Ask about masseters next time, did not do that this time. DId not do eyes, or temples or LS. She may want masseters next time though. At baseline she has Daily headaches, 12 are migrainous.  Reviewed orally with patient, additionally signature is on file:  Botulism toxin has been approved by the Federal drug administration for treatment of chronic migraine. Botulism toxin does not cure chronic migraine and it may not be effective in some patients.  The administration of botulism toxin is accomplished by injecting a small amount of toxin into the muscles of the neck and head. Dosage must be titrated for each individual. Any benefits resulting from botulism toxin tend to wear off after 3 months with a repeat injection required if benefit is to be maintained. Injections are usually done every 3-4 months with maximum effect peak achieved by about 2 or 3 weeks. Botulism toxin is expensive and you should be sure of what costs you will incur resulting from the injection.  The side effects of botulism toxin use for chronic migraine may include:   -Transient, and usually mild, facial weakness with facial injections  -Transient, and usually mild, head or neck weakness with head/neck injections  -Reduction or loss of forehead facial animation due to forehead muscle weakness  -Eyelid drooping  -Dry eye  -Pain at the site of injection or bruising at the site of injection  -Double vision  -Potential unknown long term risks  Contraindications: You should not have Botox if you are pregnant, nursing, allergic to albumin, have an infection, skin condition, or muscle weakness at the site of the injection, or have myasthenia gravis, Lambert-Eaton syndrome, or ALS.  It is also possible that as with any injection, there may be an allergic reaction or no effect  from the medication. Reduced effectiveness after repeated injections is sometimes seen and rarely infection at the injection site may occur. All care will be taken to prevent these side effects. If therapy is given over a long time, atrophy and wasting in the muscle injected may occur. Occasionally the patient's become refractory to treatment because they develop antibodies to the toxin. In this event, therapy needs to be modified.  I have read the above information and consent to the administration of botulism toxin.    BOTOX PROCEDURE NOTE FOR MIGRAINE HEADACHE    Contraindications and precautions discussed with patient(above). Aseptic procedure was observed and patient tolerated procedure. Procedure performed by Dr. Toni Lamoine Fredricksen  The condition has existed for more than 6 months, and pt does not have a diagnosis of ALS, Myasthenia Gravis or Lambert-Eaton Syndrome.  Risks and benefits of injections discussed and pt agrees to proceed with the procedure.  Written consent obtained  These injections are medically necessary. Pt  receives good benefits from these injections. These injections do not cause sedations or hallucinations which the oral therapies may cause.  Indication/Diagnosis: chronic migraine BOTOX(J0585) injection was performed according to protocol by [employer]. 200 units of BOTOX was dissolved into 4 cc NS.   NDC: [address]-01   Description of procedure:  The patient was placed in a sitting position. The standard protocol was used for Botox as follows, with 5 units of Botox injected at each site:   -Procerus muscle, midline injection  -Corrugator muscle, bilateral injection  -Frontalis muscle, bilateral injection, with 2 sites each side, medial injection was performed in the upper   one third of the frontalis muscle, in the region vertical from the medial inferior edge of the superior orbital rim. The lateral injection was again in the upper one third of the forehead vertically  above the lateral limbus of the cornea, 1.5 cm lateral to the medial injection site.  -Temporalis muscle injection, 4 sites, bilaterally. The first injection was 3 cm above the tragus of the ear, second injection site was 1.5 cm to 3 cm up from the first injection site in line with the tragus of the ear. The third injection site was 1.5-3 cm forward between the first 2 injection sites. The fourth injection site was 1.5 cm posterior to the second injection site.  -Occipitalis muscle injection, 3 sites, bilaterally. The first injection was done one half way between the occipital protuberance and the tip of the mastoid process behind the ear. The second injection site was done lateral and superior to the first, 1 fingerbreadth from the first injection. The third injection site was 1 fingerbreadth superiorly and medially from the first injection site.  -Cervical paraspinal muscle injection, 2 sites, bilateral knee first injection site was 1 cm from the midline of the cervical spine, 3 cm inferior to the lower border of the occipital protuberance. The second injection site was 1.5 cm superiorly and laterally to the first injection site.  -Trapezius muscle injection was performed at 3 sites, bilaterally. The first injection site was in the upper trapezius muscle halfway between the inflection point of the neck, and the acromion. The second injection site was one half way between the acromion and the first injection site. The third injection was done between the first injection site and the inflection point of the neck.   Will return for repeat injection in 3 months.   A 200 unit sof Botox was used, 155 units were injected, the rest of the Botox was wasted. The patient tolerated the procedure well, there were no complications of the above procedure.   

## 2018-04-25 NOTE — Telephone Encounter (Signed)
Tammy Farley with [facility] calling back stating our office needs to call [employer] medical and add [facility] NPI# which is [phone].

## 2018-04-25 NOTE — Telephone Encounter (Signed)
Tammy Farley with [facility] calling regarding PA for Botox. He was given PA# but he needs to know how many units. He would like PA form faxed to him at [phone] with information.

## 2018-05-02 NOTE — Telephone Encounter (Signed)
Patient does not fill medication again until FEB new auth will be sent over at that time.

## 2018-05-10 DIAGNOSIS — G894 Chronic pain syndrome: Secondary | ICD-10-CM | POA: Diagnosis not present

## 2018-05-10 DIAGNOSIS — M47812 Spondylosis without myelopathy or radiculopathy, cervical region: Secondary | ICD-10-CM | POA: Diagnosis not present

## 2018-05-10 DIAGNOSIS — M797 Fibromyalgia: Secondary | ICD-10-CM | POA: Diagnosis not present

## 2018-05-10 DIAGNOSIS — M47817 Spondylosis without myelopathy or radiculopathy, lumbosacral region: Secondary | ICD-10-CM | POA: Diagnosis not present

## 2018-05-30 ENCOUNTER — Telehealth: Payer: Self-pay | Admitting: Internal Medicine

## 2018-05-30 NOTE — Telephone Encounter (Signed)
She and Kemper DurieClarke baby sat their granddaughter in the office on Monday.  On Tuesday she was diagnosed with the flu.  She wants to know if they should be treated with Tamiflu as a precaution.    She is scheduled to come in to see you on Monday to discuss some other medical issues.  Kemper DurieClarke is scheduled for his CPE on Tuesday, 12/17 to see you.    Just FYI.

## 2018-05-30 NOTE — Telephone Encounter (Signed)
Spoke with patient to advise of this message.  She will watch for signs/symptoms and call if needed.

## 2018-05-30 NOTE — Telephone Encounter (Signed)
They do not have significant comorbidities to be prophylaxed. If they get sick they should be seen

## 2018-06-04 ENCOUNTER — Ambulatory Visit: Payer: BLUE CROSS/BLUE SHIELD | Admitting: Internal Medicine

## 2018-06-26 ENCOUNTER — Other Ambulatory Visit: Payer: Self-pay | Admitting: Internal Medicine

## 2018-07-05 DIAGNOSIS — M47812 Spondylosis without myelopathy or radiculopathy, cervical region: Secondary | ICD-10-CM | POA: Diagnosis not present

## 2018-07-05 DIAGNOSIS — M47817 Spondylosis without myelopathy or radiculopathy, lumbosacral region: Secondary | ICD-10-CM | POA: Diagnosis not present

## 2018-07-05 DIAGNOSIS — G894 Chronic pain syndrome: Secondary | ICD-10-CM | POA: Diagnosis not present

## 2018-07-05 DIAGNOSIS — M797 Fibromyalgia: Secondary | ICD-10-CM | POA: Diagnosis not present

## 2018-07-25 ENCOUNTER — Ambulatory Visit: Payer: BLUE CROSS/BLUE SHIELD | Admitting: Neurology

## 2018-08-01 ENCOUNTER — Telehealth: Payer: Self-pay | Admitting: Neurology

## 2018-08-01 ENCOUNTER — Ambulatory Visit: Payer: BLUE CROSS/BLUE SHIELD | Admitting: Neurology

## 2018-08-01 DIAGNOSIS — G43711 Chronic migraine without aura, intractable, with status migrainosus: Secondary | ICD-10-CM

## 2018-08-01 MED ORDER — ERENUMAB-AOOE 140 MG/ML ~~LOC~~ SOAJ: 140.0000 mg | SUBCUTANEOUS | 11 refills | Status: AC

## 2018-08-01 NOTE — Telephone Encounter (Signed)
I called and scheduled the patient.  °

## 2018-08-01 NOTE — Telephone Encounter (Signed)
3 mos btx °

## 2018-08-01 NOTE — Addendum Note (Signed)
Addended by: Naomie Dean B on: 08/01/2018 11:48 AM   Modules accepted: Orders

## 2018-08-01 NOTE — Progress Notes (Signed)
Consent Form Botulism Toxin Injection For Chronic Migraine  Interval history 08/01/2018: This is our 4th botox.  Exceptional response, > 90% decrease in frequency and severity. Ask about masseters next time, did not do that this time. +Left masseter. At baseline she has Daily headaches, 12 are migrainous.  Reviewed orally with patient, additionally signature is on file:  Botulism toxin has been approved by the Federal drug administration for treatment of chronic migraine. Botulism toxin does not cure chronic migraine and it may not be effective in some patients.  The administration of botulism toxin is accomplished by injecting a small amount of toxin into the muscles of the neck and head. Dosage must be titrated for each individual. Any benefits resulting from botulism toxin tend to wear off after 3 months with a repeat injection required if benefit is to be maintained. Injections are usually done every 3-4 months with maximum effect peak achieved by about 2 or 3 weeks. Botulism toxin is expensive and you should be sure of what costs you will incur resulting from the injection.  The side effects of botulism toxin use for chronic migraine may include:   -Transient, and usually mild, facial weakness with facial injections  -Transient, and usually mild, head or neck weakness with head/neck injections  -Reduction or loss of forehead facial animation due to forehead muscle weakness  -Eyelid drooping  -Dry eye  -Pain at the site of injection or bruising at the site of injection  -Double vision  -Potential unknown long term risks  Contraindications: You should not have Botox if you are pregnant, nursing, allergic to albumin, have an infection, skin condition, or muscle weakness at the site of the injection, or have myasthenia gravis, Lambert-Eaton syndrome, or ALS.  It is also possible that as with any injection, there may be an allergic reaction or no effect from the medication. Reduced  effectiveness after repeated injections is sometimes seen and rarely infection at the injection site may occur. All care will be taken to prevent these side effects. If therapy is given over a long time, atrophy and wasting in the muscle injected may occur. Occasionally the patient's become refractory to treatment because they develop antibodies to the toxin. In this event, therapy needs to be modified.  I have read the above information and consent to the administration of botulism toxin.    BOTOX PROCEDURE NOTE FOR MIGRAINE HEADACHE    Contraindications and precautions discussed with patient(above). Aseptic procedure was observed and patient tolerated procedure. Procedure performed by Dr. Artemio Alyoni Roland Prine  The condition has existed for more than 6 months, and pt does not have a diagnosis of ALS, Myasthenia Gravis or Lambert-Eaton Syndrome.  Risks and benefits of injections discussed and pt agrees to proceed with the procedure.  Written consent obtained  These injections are medically necessary. Pt  receives good benefits from these injections. These injections do not cause sedations or hallucinations which the oral therapies may cause.  Indication/Diagnosis: chronic migraine BOTOX(J0585) injection was performed according to protocol by [employer]. 200 units of BOTOX was dissolved into 4 cc NS.   NDC: [address]-7800023-1145-01   Description of procedure:  The patient was placed in a sitting position. The standard protocol was used for Botox as follows, with 5 units of Botox injected at each site:   -Procerus muscle, midline injection  -Corrugator muscle, bilateral injection  -Frontalis muscle, bilateral injection, with 2 sites each side, medial injection was performed in the upper one third of the frontalis muscle, in the  region vertical from the medial inferior edge of the superior orbital rim. The lateral injection was again in the upper one third of the forehead vertically above the lateral limbus of  the cornea, 1.5 cm lateral to the medial injection site.  -Temporalis muscle injection, 4 sites, bilaterally. The first injection was 3 cm above the tragus of the ear, second injection site was 1.5 cm to 3 cm up from the first injection site in line with the tragus of the ear. The third injection site was 1.5-3 cm forward between the first 2 injection sites. The fourth injection site was 1.5 cm posterior to the second injection site.  -Occipitalis muscle injection, 3 sites, bilaterally. The first injection was done one half way between the occipital protuberance and the tip of the mastoid process behind the ear. The second injection site was done lateral and superior to the first, 1 fingerbreadth from the first injection. The third injection site was 1 fingerbreadth superiorly and medially from the first injection site.  -Cervical paraspinal muscle injection, 2 sites, bilateral knee first injection site was 1 cm from the midline of the cervical spine, 3 cm inferior to the lower border of the occipital protuberance. The second injection site was 1.5 cm superiorly and laterally to the first injection site.  -Trapezius muscle injection was performed at 3 sites, bilaterally. The first injection site was in the upper trapezius muscle halfway between the inflection point of the neck, and the acromion. The second injection site was one half way between the acromion and the first injection site. The third injection was done between the first injection site and the inflection point of the neck.   Will return for repeat injection in 3 months.   A 200 unit sof Botox was used, 155 units were injected, the rest of the Botox was wasted. The patient tolerated the procedure well, there were no complications of the above procedure.

## 2018-08-01 NOTE — Progress Notes (Signed)
Botox- 100 units x 2 vials Lot: [id number] Expiration: 01/2021 NDC: 0023-1145-01  Bacteriostatic 0.9% Sodium Chloride- 4mL total Lot: [id number] Expiration: 03/21/2019 NDC: 0409-1966-02  Dx: G43.711 B/B   

## 2018-08-07 ENCOUNTER — Telehealth: Payer: Self-pay | Admitting: *Deleted

## 2018-08-07 NOTE — Telephone Encounter (Signed)
Aimovig 140 mg pen PA completed on Cover My Meds. KEY: [id number].  "If Tammy Farley has not responded in 3 business days or if you have any questions about your submission, contact Tammy Farley at [phone]."

## 2018-08-08 NOTE — Telephone Encounter (Signed)
Patient to call inquiring about co pay card. SHe stated her aimovig was denied, and she was given a co pay card. The pharmacy was given the co pay card and the aimovig was free. I stated she can get the aimovig free a year if she keeps the same insurance. Pt does have the aimovig and verbalized understanding.

## 2018-08-08 NOTE — Telephone Encounter (Signed)
Aimovig 140 mg has been denied. Pt is on Botox and also is not currently experiencing enough headaches to qualify for Aimovig approval. Patient still should be able to get this medication with the savings card.   I called pt and LVM (ok per DPR) and advised that the Aimovig was not covered by insurance because of botox and not meeting clinical requirements of number of headaches & migraines. However she should still be able to get the medication for free for a year with the savings card. I asked for a call back if she has any questions. Left office number in message. Also advised that if she does not already have a card she can pick one up here at the office or download from [email or web address].

## 2018-08-14 ENCOUNTER — Ambulatory Visit: Payer: BLUE CROSS/BLUE SHIELD | Admitting: Internal Medicine

## 2018-08-14 ENCOUNTER — Encounter: Payer: Self-pay | Admitting: Internal Medicine

## 2018-08-14 VITALS — BP 110/80 | HR 96 | Temp 98.8°F | Ht 63.0 in | Wt 182.0 lb

## 2018-08-14 DIAGNOSIS — R350 Frequency of micturition: Secondary | ICD-10-CM

## 2018-08-14 DIAGNOSIS — R8290 Unspecified abnormal findings in urine: Secondary | ICD-10-CM

## 2018-08-14 DIAGNOSIS — K5903 Drug induced constipation: Secondary | ICD-10-CM

## 2018-08-14 DIAGNOSIS — R5383 Other fatigue: Secondary | ICD-10-CM

## 2018-08-14 DIAGNOSIS — N390 Urinary tract infection, site not specified: Secondary | ICD-10-CM

## 2018-08-14 DIAGNOSIS — M797 Fibromyalgia: Secondary | ICD-10-CM

## 2018-08-14 DIAGNOSIS — T402X5A Adverse effect of other opioids, initial encounter: Secondary | ICD-10-CM

## 2018-08-14 LAB — POCT URINALYSIS DIPSTICK
Bilirubin, UA: NEGATIVE
Glucose, UA: NEGATIVE
KETONES UA: NEGATIVE
Nitrite, UA: NEGATIVE
Protein, UA: NEGATIVE
Spec Grav, UA: 1.01 (ref 1.010–1.025)
Urobilinogen, UA: 0.2 E.U./dL
pH, UA: 6 (ref 5.0–8.0)

## 2018-08-14 MED ORDER — AMOXICILLIN-POT CLAVULANATE 500-125 MG PO TABS: 500.0000 mg | ORAL_TABLET | Freq: Three times a day (TID) | ORAL | 0 refills | Status: DC

## 2018-08-14 NOTE — Progress Notes (Signed)
   Subjective:    Patient ID: Tammy Farley, female    DOB: 02-Mar-1961, 58 y.o.   MRN: [id number]  HPI 58 year old Female with onset fatigue last week.  Did not have UTI symptoms.  Over the weekend stayed in bed due to fatigue.  She has a history of fibromyalgia syndrome and thought that might be the reason she was so fatigued.  Subsequently noticed some dark urine that was slightly cloudy.  She does have a history of recurrent urinary infections.  Also she is considering going off of her narcotic pain medication which she has taken for a number of years for fibromyalgia symptoms.  Currently under care of [facility] for narcotic pain medication.  She says it causes lots of issues with constipation.  She spoke with a friend about going to detox facility that treats fibromyalgia syndrome [place].  She is considering it.  She does take amities a for chronic constipation.    Review of Systems Left CVA tenderness.  No nausea vomiting fever or chills     Objective:   Physical Exam  Dipstick UA is abnormal.  Culture was sent.  Vital signs reviewed.  Mild left CVA tenderness.  Does not look toxic.      Assessment & Plan:  Acute UTI  Fibromyalgia syndrome  Opioid-induced constipation  Plan: Last UTI was resistant to fluoroquinolones.  Try Augmentin 500 mg 3 times a day for 10 days.  Urine culture pending.  May want to find rheumatologist to treat fibromyalgia syndrome.  May want to discuss with chronic pain management physician weaning off of narcotic medication.  Time spent with patient in terms of discussion regarding fibromyalgia and treatment of acute UTI as well as management of chronic opioid-induced constipation was 20 minutes

## 2018-08-14 NOTE — Patient Instructions (Signed)
Augmentin 500 mg 3 times a day for 10 days.  Urine culture pending.  Discussed her desire for weaning off of opioid medication.  Chronic pain management physician can assist with this.  Says she may want consultation with a rheumatologist for fibromyalgia management.

## 2018-08-16 ENCOUNTER — Encounter: Payer: Self-pay | Admitting: Internal Medicine

## 2018-08-16 ENCOUNTER — Other Ambulatory Visit (INDEPENDENT_AMBULATORY_CARE_PROVIDER_SITE_OTHER): Payer: BLUE CROSS/BLUE SHIELD | Admitting: Internal Medicine

## 2018-08-16 VITALS — BP 110/80 | HR 80 | Temp 98.3°F | Ht 63.0 in | Wt 182.0 lb

## 2018-08-16 DIAGNOSIS — Z22322 Carrier or suspected carrier of Methicillin resistant Staphylococcus aureus: Secondary | ICD-10-CM

## 2018-08-16 DIAGNOSIS — N390 Urinary tract infection, site not specified: Secondary | ICD-10-CM | POA: Diagnosis not present

## 2018-08-16 LAB — POCT URINALYSIS DIPSTICK
Bilirubin, UA: NEGATIVE
Glucose, UA: NEGATIVE
Ketones, UA: NEGATIVE
NITRITE UA: NEGATIVE
Protein, UA: NEGATIVE
Spec Grav, UA: 1.015 (ref 1.010–1.025)
Urobilinogen, UA: 0.2 E.U./dL
pH, UA: 6 (ref 5.0–8.0)

## 2018-08-16 LAB — URINE CULTURE
MICRO NUMBER:: 239445
SPECIMEN QUALITY:: ADEQUATE

## 2018-08-16 MED ORDER — NITROFURANTOIN MONOHYD MACRO 100 MG PO CAPS: 100.0000 mg | ORAL_CAPSULE | Freq: Two times a day (BID) | ORAL | 0 refills | Status: AC

## 2018-08-16 NOTE — Progress Notes (Signed)
   Subjective:    Patient ID: Tammy Farley, female    DOB: [birth year], 58 y.o.   MRN: [id number]  HPI Her urine culture grew 10,000 -50,000 CFU/ml of Staphylococcus lugdunesis which is oxacillin resistant.  She still does not feel 100% improved having been taking Augmentin for a few days since February 25. I asked her to come back for repeat urine specimen.  Repeat UA is still not 100% normal but she is only been on antibiotics a couple of days.  We are changing to Macrobid 100 mg twice daily for 10 days.  She will come back for repeat urine specimen and nurse visit March 5.  We have recultured her urine today.   Review of Systems see above     Objective:   Physical Exam Afebrile.  No CVA tenderness.  See dipstick UA.       Assessment & Plan:  Oxacillin resistant UTI  Plan: Switch to Macrobid 100 mg twice daily for 10 days and follow-up March for this.

## 2018-08-16 NOTE — Patient Instructions (Signed)
Start Macrobid 100 mg twice daily for 10 days.  Follow-up next week with repeat UA and nurse visit.

## 2018-08-17 LAB — URINE CULTURE
MICRO NUMBER:: 251031
Result:: NO GROWTH
SPECIMEN QUALITY:: ADEQUATE

## 2018-08-23 ENCOUNTER — Encounter: Payer: Self-pay | Admitting: Internal Medicine

## 2018-08-23 ENCOUNTER — Other Ambulatory Visit (INDEPENDENT_AMBULATORY_CARE_PROVIDER_SITE_OTHER): Payer: BLUE CROSS/BLUE SHIELD | Admitting: Internal Medicine

## 2018-08-23 VITALS — BP 120/88 | HR 80 | Temp 98.7°F | Ht 63.0 in | Wt 182.0 lb

## 2018-08-23 DIAGNOSIS — Z0000 Encounter for general adult medical examination without abnormal findings: Secondary | ICD-10-CM | POA: Diagnosis not present

## 2018-08-23 DIAGNOSIS — R8290 Unspecified abnormal findings in urine: Secondary | ICD-10-CM

## 2018-08-23 DIAGNOSIS — K5909 Other constipation: Secondary | ICD-10-CM

## 2018-08-23 DIAGNOSIS — Z8719 Personal history of other diseases of the digestive system: Secondary | ICD-10-CM

## 2018-08-29 DIAGNOSIS — M797 Fibromyalgia: Secondary | ICD-10-CM | POA: Diagnosis not present

## 2018-08-29 DIAGNOSIS — G894 Chronic pain syndrome: Secondary | ICD-10-CM | POA: Diagnosis not present

## 2018-08-29 DIAGNOSIS — M47812 Spondylosis without myelopathy or radiculopathy, cervical region: Secondary | ICD-10-CM | POA: Diagnosis not present

## 2018-08-29 DIAGNOSIS — Z79891 Long term (current) use of opiate analgesic: Secondary | ICD-10-CM | POA: Diagnosis not present

## 2018-08-29 DIAGNOSIS — M47817 Spondylosis without myelopathy or radiculopathy, lumbosacral region: Secondary | ICD-10-CM | POA: Diagnosis not present

## 2018-09-11 ENCOUNTER — Telehealth: Payer: Self-pay | Admitting: Internal Medicine

## 2018-09-11 MED ORDER — VALACYCLOVIR HCL 1 G PO TABS: 1000.0000 mg | ORAL_TABLET | Freq: Two times a day (BID) | ORAL | 0 refills | Status: AC

## 2018-09-11 NOTE — Telephone Encounter (Signed)
Laury Deep [phone]  Tammy Farley called to say she has rash in the crack of her but checks and she has been putting some hydrocortisone cream on it that you had gave her awhile back, it does not seem to be working the place is growing. She wanted to know if there was something else she could use without having to come into office. I let her know you might would have to see it to treat it.

## 2018-09-11 NOTE — Telephone Encounter (Signed)
This could be a Herpetic outbreak. Try Valtrex and see if improved. Should have refills from previous visit.

## 2018-09-15 ENCOUNTER — Other Ambulatory Visit: Payer: Self-pay | Admitting: Internal Medicine

## 2018-09-17 ENCOUNTER — Other Ambulatory Visit: Payer: Self-pay | Admitting: Internal Medicine

## 2018-09-17 LAB — POCT URINALYSIS DIPSTICK
Appearance: NEGATIVE
Bilirubin, UA: NEGATIVE
Blood, UA: NEGATIVE
GLUCOSE UA: NEGATIVE
Ketones, UA: NEGATIVE
Leukocytes, UA: NEGATIVE
Nitrite, UA: NEGATIVE
Odor: NEGATIVE
Protein, UA: NEGATIVE
Spec Grav, UA: 1.01 (ref 1.010–1.025)
Urobilinogen, UA: 0.2 E.U./dL
pH, UA: 6.5 (ref 5.0–8.0)

## 2018-09-17 MED ORDER — ALBUTEROL SULFATE HFA 108 (90 BASE) MCG/ACT IN AERS: 2.0000 | INHALATION_SPRAY | Freq: Four times a day (QID) | RESPIRATORY_TRACT | 2 refills | Status: AC | PRN

## 2018-09-17 NOTE — Patient Instructions (Signed)
Urine recheck was normal. Patient stated she symptoms have improved.

## 2018-09-17 NOTE — Addendum Note (Signed)
Addended by: Gregery Na on: 09/17/2018 10:26 AM   Modules accepted: Orders

## 2018-09-17 NOTE — Telephone Encounter (Signed)
Tammy Farley [phone]  [facility] in [facility]  [place] called to say the pharmacy told her to call to make sure we got the refill request.

## 2018-09-23 ENCOUNTER — Telehealth: Payer: Self-pay | Admitting: Internal Medicine

## 2018-09-23 ENCOUNTER — Encounter: Payer: Self-pay | Admitting: Internal Medicine

## 2018-09-23 NOTE — Telephone Encounter (Signed)
Spoke with patient's sister, Nelida Meuse. Pt was in [facility] at a detox center to get off opioid med and apparently was found unresponsive and expired. Autospy being performed.

## 2018-09-24 ENCOUNTER — Other Ambulatory Visit: Payer: Self-pay

## 2018-09-24 NOTE — Telephone Encounter (Signed)
Can we send a card please? thanks

## 2018-09-24 NOTE — Telephone Encounter (Signed)
[employer]pathy card sent to pt's address on file.

## 2018-10-19 DEATH — deceased

## 2018-10-31 ENCOUNTER — Ambulatory Visit: Payer: BLUE CROSS/BLUE SHIELD | Admitting: Neurology

## 2019-04-19 ENCOUNTER — Telehealth: Payer: Self-pay | Admitting: Internal Medicine

## 2019-04-19 ENCOUNTER — Encounter: Payer: Self-pay | Admitting: Internal Medicine

## 2019-04-19 NOTE — Telephone Encounter (Signed)
Unable to LVM - phione numbers are not correct, so I sent letter to call office and update information and schedule CPE and Lab appointments.

## 2019-09-16 NOTE — Telephone Encounter (Signed)
Patient is deceased.
